# Patient Record
Sex: Female | Born: 1990 | Race: Black or African American | Hispanic: No | Marital: Married | Smoking: Former smoker
Health system: Southern US, Community
[De-identification: ages and names within clinical notes are randomized; demographics above are authoritative.]

## PROBLEM LIST (undated history)

## (undated) ENCOUNTER — Inpatient Hospital Stay (HOSPITAL_COMMUNITY): Payer: Self-pay

## (undated) DIAGNOSIS — O24419 Gestational diabetes mellitus in pregnancy, unspecified control: Secondary | ICD-10-CM

## (undated) HISTORY — DX: Gestational diabetes mellitus in pregnancy, unspecified control: O24.419

## (undated) HISTORY — PX: NO PAST SURGERIES: SHX2092

---

## 2018-04-16 DIAGNOSIS — Z3200 Encounter for pregnancy test, result unknown: Secondary | ICD-10-CM | POA: Diagnosis not present

## 2018-04-16 DIAGNOSIS — Z3009 Encounter for other general counseling and advice on contraception: Secondary | ICD-10-CM | POA: Diagnosis not present

## 2018-04-27 DIAGNOSIS — Z3009 Encounter for other general counseling and advice on contraception: Secondary | ICD-10-CM | POA: Diagnosis not present

## 2018-04-27 DIAGNOSIS — Z0389 Encounter for observation for other suspected diseases and conditions ruled out: Secondary | ICD-10-CM | POA: Diagnosis not present

## 2018-04-27 DIAGNOSIS — Z1388 Encounter for screening for disorder due to exposure to contaminants: Secondary | ICD-10-CM | POA: Diagnosis not present

## 2018-05-24 ENCOUNTER — Other Ambulatory Visit (HOSPITAL_COMMUNITY)
Admission: RE | Admit: 2018-05-24 | Discharge: 2018-05-24 | Disposition: A | Discharge status: Home / Self Care | Payer: Medicaid Other | Source: Ambulatory Visit | Attending: Advanced Practice Midwife | Admitting: Advanced Practice Midwife

## 2018-05-24 ENCOUNTER — Encounter: Payer: Self-pay | Admitting: Advanced Practice Midwife

## 2018-05-24 ENCOUNTER — Ambulatory Visit (INDEPENDENT_AMBULATORY_CARE_PROVIDER_SITE_OTHER): Payer: Medicaid Other | Admitting: Advanced Practice Midwife

## 2018-05-24 VITALS — BP 116/75 | HR 83 | Ht <= 58 in | Wt 175.0 lb

## 2018-05-24 DIAGNOSIS — O26891 Other specified pregnancy related conditions, first trimester: Secondary | ICD-10-CM

## 2018-05-24 DIAGNOSIS — Z3687 Encounter for antenatal screening for uncertain dates: Secondary | ICD-10-CM

## 2018-05-24 DIAGNOSIS — Z3490 Encounter for supervision of normal pregnancy, unspecified, unspecified trimester: Secondary | ICD-10-CM | POA: Diagnosis not present

## 2018-05-24 DIAGNOSIS — R12 Heartburn: Secondary | ICD-10-CM

## 2018-05-24 DIAGNOSIS — Z3481 Encounter for supervision of other normal pregnancy, first trimester: Secondary | ICD-10-CM

## 2018-05-24 MED ORDER — RANITIDINE HCL 150 MG PO TABS: 150.0000 mg | ORAL_TABLET | Freq: Two times a day (BID) | ORAL | 2 refills | Status: DC

## 2018-05-24 NOTE — Progress Notes (Signed)
   PRENATAL VISIT NOTE  Subjective:  Zoe Jensen is a 27 y.o. G3P2002 at [redacted]w[redacted]d being seen today for initial prenatal visit.  She is currently monitored for the following issues for this low-risk pregnancy and has Supervision of normal pregnancy on their problem list.  Patient reports heartburn.  Contractions: Not present. Vag. Bleeding: None.   . Denies leaking of fluid.   The following portions of the patient's history were reviewed and updated as appropriate: allergies, current medications, past family history, past medical history, past social history, past surgical history and problem list. Problem list updated.  Objective:   Vitals:   05/24/18 0923 05/24/18 0926  BP: 116/75   Pulse: 83   Weight: 79.4 kg   Height:  4\' 10"  (1.473 m)    Fetal Status: Fetal Heart Rate (bpm): 165          Assessment and Plan:  Pregnancy: G3P2002 at [redacted]w[redacted]d BP 116/75   Pulse 83   Ht 4\' 10"  (1.473 m)   Wt 79.4 kg   LMP 03/10/2018   BMI 36.58 kg/m    VS reviewed, nursing note reviewed,  Constitutional: well developed, well nourished, no distress HEENT: normocephalic HEART: normal rate, heart sounds, regular rhythm RESP: normal effort, lung sounds clear and equal bilaterally Abdomen: soft Neuro: alert and oriented x 3 Skin: warm, dry Psych: affect normal Pelvic exam deferred, Pap 6 months ago, records requested  1. Encounter for supervision of normal pregnancy, antepartum, unspecified gravidity --Discussed and offered genetic screening options, including Quad screen/AFP, NIPS testing, and option to decline testing. Benefits/risks/alternatives reviewed. Pt aware that anatomy US is form of genetic screening with lower accuracy in detecting trisomies than blood work.  Pt chooses/declines genetic screening today. --Anticipatory guidance about next visits/weeks of pregnancy given.  - Obstetric Panel, Including HIV - Culture, OB Urine - Cytology - PAP - Genetic Screening - Hemoglobinopathy  evaluation - Cystic Fibrosis Mutation 97 - SMN1 COPY NUMBER ANALYSIS (SMA Carrier Screen)  2. Unsure of LMP (last menstrual period) as reason for ultrasound scan  - US OB Comp Less 14 Wks; Future  3. Heartburn during pregnancy in first trimester --May use Tums PRN but if using Tums frequently, start Zantac BID PRN, see Rx.  Preterm labor symptoms and general obstetric precautions including but not limited to vaginal bleeding, contractions, leaking of fluid and fetal movement were reviewed in detail with the patient. Please refer to After Visit Summary for other counseling recommendations.  No follow-ups on file.  Future Appointments  Date Time Provider Department Center  05/31/2018  9:00 AM [facility]  06/23/2018  9:30 AM Leftwich-Kirby, Wilmer Floor, CNM [facility] None    Sharen Counter, CNM

## 2018-05-24 NOTE — Patient Instructions (Signed)
First Trimester of Pregnancy The first trimester of pregnancy is from week 1 until the end of week 13 (months 1 through 3). A week after a sperm fertilizes an egg, the egg will implant on the wall of the uterus. This embryo will begin to develop into a baby. Genes from you and your partner will form the baby. The female genes will determine whether the baby will be a boy or a girl. At 6-8 weeks, the eyes and face will be formed, and the heartbeat can be seen on ultrasound. At the end of 12 weeks, all the baby's organs will be formed. Now that you are pregnant, you will want to do everything you can to have a healthy baby. Two of the most important things are to get good prenatal care and to follow your health care provider's instructions. Prenatal care is all the medical care you receive before the baby's birth. This care will help prevent, find, and treat any problems during the pregnancy and childbirth. Body changes during your first trimester Your body goes through many changes during pregnancy. The changes vary from woman to woman.  You may gain or lose a couple of pounds at first.  You may feel sick to your stomach (nauseous) and you may throw up (vomit). If the vomiting is uncontrollable, call your health care provider.  You may tire easily.  You may develop headaches that can be relieved by medicines. All medicines should be approved by your health care provider.  You may urinate more often. Painful urination may mean you have a bladder infection.  You may develop heartburn as a result of your pregnancy.  You may develop constipation because certain hormones are causing the muscles that push stool through your intestines to slow down.  You may develop hemorrhoids or swollen veins (varicose veins).  Your breasts may begin to grow larger and become tender. Your nipples may stick out more, and the tissue that surrounds them (areola) may become darker.  Your gums may bleed and may be  sensitive to brushing and flossing.  Dark spots or blotches (chloasma, mask of pregnancy) may develop on your face. This will likely fade after the baby is born.  Your menstrual periods will stop.  You may have a loss of appetite.  You may develop cravings for certain kinds of food.  You may have changes in your emotions from day to day, such as being excited to be pregnant or being concerned that something may go wrong with the pregnancy and baby.  You may have more vivid and strange dreams.  You may have changes in your hair. These can include thickening of your hair, rapid growth, and changes in texture. Some women also have hair loss during or after pregnancy, or hair that feels dry or thin. Your hair will most likely return to normal after your baby is born.  What to expect at prenatal visits During a routine prenatal visit:  You will be weighed to make sure you and the baby are growing normally.  Your blood pressure will be taken.  Your abdomen will be measured to track your baby's growth.  The fetal heartbeat will be listened to between weeks 10 and 14 of your pregnancy.  Test results from any previous visits will be discussed.  Your health care provider may ask you:  How you are feeling.  If you are feeling the baby move.  If you have had any abnormal symptoms, such as leaking fluid, bleeding, severe headaches,   or abdominal cramping.  If you are using any tobacco products, including cigarettes, chewing tobacco, and electronic cigarettes.  If you have any questions.  Other tests that may be performed during your first trimester include:  Blood tests to find your blood type and to check for the presence of any previous infections. The tests will also be used to check for low iron levels (anemia) and protein on red blood cells (Rh antibodies). Depending on your risk factors, or if you previously had diabetes during pregnancy, you may have tests to check for high blood  sugar that affects pregnant women (gestational diabetes).  Urine tests to check for infections, diabetes, or protein in the urine.  An ultrasound to confirm the proper growth and development of the baby.  Fetal screens for spinal cord problems (spina bifida) and Down syndrome.  HIV (human immunodeficiency virus) testing. Routine prenatal testing includes screening for HIV, unless you choose not to have this test.  You may need other tests to make sure you and the baby are doing well.  Follow these instructions at home: Medicines  Follow your health care provider's instructions regarding medicine use. Specific medicines may be either safe or unsafe to take during pregnancy.  Take a prenatal vitamin that contains at least 600 micrograms (mcg) of folic acid.  If you develop constipation, try taking a stool softener if your health care provider approves. Eating and drinking  Eat a balanced diet that includes fresh fruits and vegetables, whole grains, good sources of protein such as meat, eggs, or tofu, and low-fat dairy. Your health care provider will help you determine the amount of weight gain that is right for you.  Avoid raw meat and uncooked cheese. These carry germs that can cause birth defects in the baby.  Eating four or five small meals rather than three large meals a day may help relieve nausea and vomiting. If you start to feel nauseous, eating a few soda crackers can be helpful. Drinking liquids between meals, instead of during meals, also seems to help ease nausea and vomiting.  Limit foods that are high in fat and processed sugars, such as fried and sweet foods.  To prevent constipation: ? Eat foods that are high in fiber, such as fresh fruits and vegetables, whole grains, and beans. ? Drink enough fluid to keep your urine clear or pale yellow. Activity  Exercise only as directed by your health care provider. Most women can continue their usual exercise routine during  pregnancy. Try to exercise for 30 minutes at least 5 days a week. Exercising will help you: ? Control your weight. ? Stay in shape. ? Be prepared for labor and delivery.  Experiencing pain or cramping in the lower abdomen or lower back is a good sign that you should stop exercising. Check with your health care provider before continuing with normal exercises.  Try to avoid standing for long periods of time. Move your legs often if you must stand in one place for a long time.  Avoid heavy lifting.  Wear low-heeled shoes and practice good posture.  You may continue to have sex unless your health care provider tells you not to. Relieving pain and discomfort  Wear a good support bra to relieve breast tenderness.  Take warm sitz baths to soothe any pain or discomfort caused by hemorrhoids. Use hemorrhoid cream if your health care provider approves.  Rest with your legs elevated if you have leg cramps or low back pain.  If you develop   varicose veins in your legs, wear support hose. Elevate your feet for 15 minutes, 3-4 times a day. Limit salt in your diet. Prenatal care  Schedule your prenatal visits by the twelfth week of pregnancy. They are usually scheduled monthly at first, then more often in the last 2 months before delivery.  Write down your questions. Take them to your prenatal visits.  Keep all your prenatal visits as told by your health care provider. This is important. Safety  Wear your seat belt at all times when driving.  Make a list of emergency phone numbers, including numbers for family, friends, the hospital, and police and fire departments. General instructions  Ask your health care provider for a referral to a local prenatal education class. Begin classes no later than the beginning of month 6 of your pregnancy.  Ask for help if you have counseling or nutritional needs during pregnancy. Your health care provider can offer advice or refer you to specialists for help  with various needs.  Do not use hot tubs, steam rooms, or saunas.  Do not douche or use tampons or scented sanitary pads.  Do not cross your legs for long periods of time.  Avoid cat litter boxes and soil used by cats. These carry germs that can cause birth defects in the baby and possibly loss of the fetus by miscarriage or stillbirth.  Avoid all smoking, herbs, alcohol, and medicines not prescribed by your health care provider. Chemicals in these products affect the formation and growth of the baby.  Do not use any products that contain nicotine or tobacco, such as cigarettes and e-cigarettes. If you need help quitting, ask your health care provider. You may receive counseling support and other resources to help you quit.  Schedule a dentist appointment. At home, brush your teeth with a soft toothbrush and be gentle when you floss. Contact a health care provider if:  You have dizziness.  You have mild pelvic cramps, pelvic pressure, or nagging pain in the abdominal area.  You have persistent nausea, vomiting, or diarrhea.  You have a bad smelling vaginal discharge.  You have pain when you urinate.  You notice increased swelling in your face, hands, legs, or ankles.  You are exposed to fifth disease or chickenpox.  You are exposed to German measles (rubella) and have never had it. Get help right away if:  You have a fever.  You are leaking fluid from your vagina.  You have spotting or bleeding from your vagina.  You have severe abdominal cramping or pain.  You have rapid weight gain or loss.  You vomit blood or material that looks like coffee grounds.  You develop a severe headache.  You have shortness of breath.  You have any kind of trauma, such as from a fall or a car accident. Summary  The first trimester of pregnancy is from week 1 until the end of week 13 (months 1 through 3).  Your body goes through many changes during pregnancy. The changes vary from  woman to woman.  You will have routine prenatal visits. During those visits, your health care provider will examine you, discuss any test results you may have, and talk with you about how you are feeling. This information is not intended to replace advice given to you by your health care provider. Make sure you discuss any questions you have with your health care provider. Document Released: 07/08/2001 Document Revised: 06/25/2016 Document Reviewed: 06/25/2016 Elsevier Interactive Patient Education  2018 [employer]  

## 2018-05-25 ENCOUNTER — Encounter (HOSPITAL_COMMUNITY): Payer: Self-pay

## 2018-05-25 ENCOUNTER — Inpatient Hospital Stay (HOSPITAL_COMMUNITY): Payer: Medicaid Other

## 2018-05-25 ENCOUNTER — Telehealth: Payer: Self-pay

## 2018-05-25 ENCOUNTER — Inpatient Hospital Stay (HOSPITAL_COMMUNITY)
Admission: AD | Admit: 2018-05-25 | Discharge: 2018-05-25 | Disposition: A | Discharge status: Home / Self Care | Payer: Medicaid Other | Source: Ambulatory Visit | Attending: Obstetrics & Gynecology | Admitting: Obstetrics & Gynecology

## 2018-05-25 DIAGNOSIS — Z87891 Personal history of nicotine dependence: Secondary | ICD-10-CM | POA: Diagnosis not present

## 2018-05-25 DIAGNOSIS — O209 Hemorrhage in early pregnancy, unspecified: Secondary | ICD-10-CM | POA: Diagnosis not present

## 2018-05-25 DIAGNOSIS — Z3A10 10 weeks gestation of pregnancy: Secondary | ICD-10-CM | POA: Insufficient documentation

## 2018-05-25 DIAGNOSIS — O208 Other hemorrhage in early pregnancy: Secondary | ICD-10-CM | POA: Diagnosis not present

## 2018-05-25 DIAGNOSIS — Z3491 Encounter for supervision of normal pregnancy, unspecified, first trimester: Secondary | ICD-10-CM

## 2018-05-25 LAB — URINALYSIS, MICROSCOPIC (REFLEX): RBC / HPF: >50 RBC/hpf (ref 0–5)

## 2018-05-25 LAB — URINALYSIS, ROUTINE W REFLEX MICROSCOPIC: Ketones, ur: 15 mg/dL — AB

## 2018-05-25 LAB — CBC
HEMATOCRIT: 34 % — AB (ref 36.0–46.0)
HEMOGLOBIN: 11.7 g/dL — AB (ref 12.0–15.0)
MCH: 29.1 pg (ref 26.0–34.0)
MCHC: 34.4 g/dL (ref 30.0–36.0)
MCV: 84.6 fL (ref 80.0–100.0)
PLATELETS: 212 10*3/uL (ref 150–400)
RBC: 4.02 MIL/uL (ref 3.87–5.11)
RDW: 12.8 % (ref 11.5–15.5)
WBC: 6.6 10*3/uL (ref 4.0–10.5)
nRBC: 0 % (ref 0.0–0.2)

## 2018-05-25 LAB — ABO/RH: ABO/RH(D): A POS

## 2018-05-25 LAB — URINE CYTOLOGY ANCILLARY ONLY
Chlamydia: NEGATIVE
Neisseria Gonorrhea: NEGATIVE

## 2018-05-25 NOTE — Discharge Instructions (Signed)
Subchorionic Hematoma °A subchorionic hematoma is a gathering of blood between the outer wall of the placenta and the inner wall of the womb (uterus). The placenta is the organ that connects the fetus to the wall of the uterus. The placenta performs the feeding, breathing (oxygen to the fetus), and waste removal (excretory work) of the fetus. °Subchorionic hematoma is the most common abnormality found on a result from ultrasonography done during the first trimester or early second trimester of pregnancy. If there has been little or no vaginal bleeding, early small hematomas usually shrink on their own and do not affect your baby or pregnancy. The blood is gradually absorbed over 1-2 weeks. When bleeding starts later in pregnancy or the hematoma is larger or occurs in an older pregnant woman, the outcome may not be as good. Larger hematomas may get bigger, which increases the chances for miscarriage. Subchorionic hematoma also increases the risk of premature detachment of the placenta from the uterus, preterm (premature) labor, and stillbirth. °Follow these instructions at home: °· Stay on bed rest if your health care provider recommends this. Although bed rest will not prevent more bleeding or prevent a miscarriage, your health care provider may recommend bed rest until you are advised otherwise. °· Avoid heavy lifting (more than 10 lb [4.5 kg]), exercise, sexual intercourse, or douching as directed by your health care provider. °· Keep track of the number of pads you use each day and how soaked (saturated) they are. Write down this information. °· Do not use tampons. °· Keep all follow-up appointments as directed by your health care provider. Your health care provider may ask you to have follow-up blood tests or ultrasound tests or both. °Get help right away if: °· You have severe cramps in your stomach, back, abdomen, or pelvis. °· You have a fever. °· You pass large clots or tissue. Save any tissue for your  health care provider to look at. °· Your bleeding increases or you become lightheaded, feel weak, or have fainting episodes. °This information is not intended to replace advice given to you by your health care provider. Make sure you discuss any questions you have with your health care provider. °Document Released: 10/29/2006 Document Revised: 12/20/2015 Document Reviewed: 02/10/2013 °Elsevier Interactive Patient Education © 2017 [employer] ° ° °Safe Medications in Pregnancy  ° °Acne: °Benzoyl Peroxide °Salicylic Acid ° °Backache/Headache: °Tylenol: 2 regular strength every 4 hours OR °             2 Extra strength every 6 hours ° °Colds/Coughs/Allergies: °Benadryl (alcohol free) 25 mg every 6 hours as needed °Breath right strips °Claritin °Cepacol throat lozenges °Chloraseptic throat spray °Cold-Eeze- up to three times per day °Cough drops, alcohol free °Flonase (by prescription only) °Guaifenesin °Mucinex °Robitussin DM (plain only, alcohol free) °Saline nasal spray/drops °Sudafed (pseudoephedrine) & Actifed ** use only after [redacted] weeks gestation and if you do not have high blood pressure °Tylenol °Vicks Vaporub °Zinc lozenges °Zyrtec  ° °Constipation: °Colace °Ducolax suppositories °Fleet enema °Glycerin suppositories °Metamucil °Milk of magnesia °Miralax °Senokot °Smooth move tea ° °Diarrhea: °Kaopectate °Imodium A-D ° °*NO pepto Bismol ° °Hemorrhoids: °Anusol °Anusol HC °Preparation H °Tucks ° °Indigestion: °Tums °Maalox °Mylanta °Zantac  °Pepcid ° °Insomnia: °Benadryl (alcohol free) 25mg every 6 hours as needed °Tylenol PM °Unisom, no Gelcaps ° °Leg Cramps: °Tums °MagGel ° °Nausea/Vomiting:  °Bonine °Dramamine °Emetrol °Ginger extract °Sea bands °Meclizine  °Nausea medication to take during pregnancy:  °Unisom (doxylamine succinate 25 mg tablets) Take one   tablet daily at bedtime. If symptoms are not adequately controlled, the dose can be increased to a maximum recommended dose of two tablets daily (1/2 tablet  in the morning, 1/2 tablet mid-afternoon and one at bedtime). °Vitamin B6 100mg tablets. Take one tablet twice a day (up to 200 mg per day). ° °Skin Rashes: °Aveeno products °Benadryl cream or 25mg every 6 hours as needed °Calamine Lotion °1% cortisone cream ° °Yeast infection: °Gyne-lotrimin 7 °Monistat 7 ° ° °**If taking multiple medications, please check labels to avoid duplicating the same active ingredients °**take medication as directed on the label °** Do not exceed 4000 mg of tylenol in 24 hours °**Do not take medications that contain aspirin or ibuprofen ° ° ° ° °

## 2018-05-25 NOTE — MAU Provider Note (Signed)
History     CSN: [id number]  Arrival date and time: 05/25/18 1550   First Provider Initiated Contact with Patient 05/25/18 1635     Chief Complaint  Patient presents with  . Vaginal Bleeding   HPI Zoe Jensen is a 28 y.o. G3P2002 at [redacted]w[redacted]d who presents with vaginal bleeding. She states she had a sudden onset of bright red vaginal bleeding like a period. She denies any pain. She had a prenatal appointment yesterday and FHT were heard. She hasn't had an ultrasounds in the pregnancy yet. Denies any recent intercourse.  OB History    Gravida  3   Para  2   Term  2   Preterm      AB      Living  2     SAB      TAB      Ectopic      Multiple      Live Births  2           History reviewed. No pertinent past medical history.  Past Surgical History:  Procedure Laterality Date  . NO PAST SURGERIES      Family History  Problem Relation Age of Onset  . Stroke Mother   . Hypertension Mother   . Diabetes Mother   . Hypertension Father     Social History   Tobacco Use  . Smoking status: Former Games developer  . Smokeless tobacco: Never Used  Substance Use Topics  . Alcohol use: Not Currently  . Drug use: Not Currently    Allergies: No Known Allergies  Medications Prior to Admission  Medication Sig Dispense Refill Last Dose  . Prenatal Vit-Fe Fumarate-FA (PRENATAL MULTIVITAMIN) TABS tablet Take 1 tablet by mouth daily at 12 noon.   05/24/2018 at Unknown time  . ranitidine (ZANTAC) 150 MG tablet Take 1 tablet (150 mg total) by mouth 2 (two) times daily. (Patient not taking: Reported on 05/25/2018) 60 tablet 2 Not Taking at Unknown time    Review of Systems  Constitutional: Negative.  Negative for fatigue and fever.  HENT: Negative.   Respiratory: Negative.  Negative for shortness of breath.   Cardiovascular: Negative.  Negative for chest pain.  Gastrointestinal: Negative.  Negative for abdominal pain, diarrhea, nausea and vomiting.  Genitourinary:  Positive for vaginal bleeding. Negative for dysuria.  Neurological: Negative.  Negative for dizziness and headaches.   Physical Exam   Blood pressure 127/63, pulse 78, temperature 98.6 F (37 C), resp. rate 18, height 4\' 10"  (1.473 m), weight 80.3 kg, last menstrual period 03/10/2018.  Physical Exam  Nursing note and vitals reviewed. Constitutional: She is oriented to person, place, and time. She appears well-developed and well-nourished. No distress.  HENT:  Head: Normocephalic.  Eyes: Pupils are equal, round, and reactive to light.  Cardiovascular: Normal rate, regular rhythm and normal heart sounds.  Respiratory: Effort normal and breath sounds normal. No respiratory distress.  GI: Soft. Bowel sounds are normal. She exhibits no distension. There is no tenderness.  Genitourinary:  Genitourinary Comments: Small amount of bright red vaginal bleeding oozing from cervical os, cervix closed on bimanual exam  Neurological: She is alert and oriented to person, place, and time.  Skin: Skin is warm and dry.  Psychiatric: She has a normal mood and affect. Her behavior is normal. Judgment and thought content normal.    MAU Course  Procedures Results for orders placed or performed during the hospital encounter of 05/25/18 (from the past 24 hour(s))  Urinalysis,  Routine w reflex microscopic     Status: Abnormal   Collection Time: 05/25/18  4:17 PM  Result Value Ref Range   Color, Urine RED (A) YELLOW   APPearance TURBID (A) CLEAR   Specific Gravity, Urine  1.005 - 1.030    TEST NOT REPORTED DUE TO COLOR INTERFERENCE OF URINE PIGMENT   pH  5.0 - 8.0    TEST NOT REPORTED DUE TO COLOR INTERFERENCE OF URINE PIGMENT   Glucose, UA (A) NEGATIVE mg/dL    TEST NOT REPORTED DUE TO COLOR INTERFERENCE OF URINE PIGMENT   Hgb urine dipstick (A) NEGATIVE    TEST NOT REPORTED DUE TO COLOR INTERFERENCE OF URINE PIGMENT   Bilirubin Urine (A) NEGATIVE    TEST NOT REPORTED DUE TO COLOR INTERFERENCE OF  URINE PIGMENT   Ketones, ur 15 (A) NEGATIVE mg/dL   Protein, ur (A) NEGATIVE mg/dL    TEST NOT REPORTED DUE TO COLOR INTERFERENCE OF URINE PIGMENT   Nitrite (A) NEGATIVE    TEST NOT REPORTED DUE TO COLOR INTERFERENCE OF URINE PIGMENT   Leukocytes, UA (A) NEGATIVE    TEST NOT REPORTED DUE TO COLOR INTERFERENCE OF URINE PIGMENT  Urinalysis, Microscopic (reflex)     Status: Abnormal   Collection Time: 05/25/18  4:17 PM  Result Value Ref Range   RBC / HPF >50 0 - 5 RBC/hpf   WBC, UA 6-10 0 - 5 WBC/hpf   Bacteria, UA MANY (A) NONE SEEN   Squamous Epithelial / LPF 0-5 0 - 5   Urine-Other MICROSCOPIC EXAM PERFORMED ON UNCONCENTRATED URINE   ABO/Rh     Status: None (Preliminary result)   Collection Time: 05/25/18  4:35 PM  Result Value Ref Range   ABO/RH(D)      A POS Performed at [facility], [address], [place], [place] [address]   CBC     Status: Abnormal   Collection Time: 05/25/18  4:35 PM  Result Value Ref Range   WBC 6.6 4.0 - 10.5 K/uL   RBC 4.02 3.87 - 5.11 MIL/uL   Hemoglobin 11.7 (L) 12.0 - 15.0 g/dL   HCT 91.4 (L) 78.2 - 95.6 %   MCV 84.6 80.0 - 100.0 fL   MCH 29.1 26.0 - 34.0 pg   MCHC 34.4 30.0 - 36.0 g/dL   RDW 21.3 08.6 - 57.8 %   Platelets 212 150 - 400 K/uL   nRBC 0.0 0.0 - 0.2 %   US Ob Less Than 14 Weeks With Ob Transvaginal  Result Date: 05/25/2018 CLINICAL DATA:  Vaginal bleeding. Gestational age by last menstrual period 10 weeks and 6 days. EXAM: OBSTETRIC <14 WK ULTRASOUND TECHNIQUE: Transabdominal ultrasound was performed for evaluation of the gestation as well as the maternal uterus and adnexal regions. COMPARISON:  None. FINDINGS: Intrauterine gestational sac: Present. Yolk sac:  Not present. Embryo:  Present. Cardiac Activity: Present. Heart Rate: 169 bpm CRL:   34 mm   10 w 1 d                  [place] EDC: Dec 20, 2018 Subchorionic hemorrhage:  None visualized. Maternal uterus/adnexae: Normal appearance of the adnexa. No free fluid. IMPRESSION: Single  live intrauterine pregnancy, gestational age by ultrasound 10 weeks and 6 days (concordant dates) without immediate complication. Electronically Signed   By: Awilda Metro M.D.   On: 05/25/2018 17:44   MDM UA CBC ABO/Rh- A Pos US OB Less than 14 weeks- normal IUP, no acute reason  for bleeding at this time.   Assessment and Plan   1. Normal intrauterine pregnancy on prenatal ultrasound in first trimester   2. Vaginal bleeding affecting early pregnancy   3. [redacted] weeks gestation of pregnancy    -Discharge home in stable condition -Vaginal bleeding precautions discussed. Pelvic rest reviewed -Patient advised to follow-up with [facility] as scheduled for prenatal care -Patient may return to MAU as needed or if her condition were to change or worsen  Rolm Bookbinder CNM 05/25/2018, 6:12 PM

## 2018-05-25 NOTE — MAU Note (Signed)
Pt reports she felt some "dripping when she was putting up groceries. Went to the BR and found vaginal bleeding like the start of her period. Denies any pain or cramping at this time.

## 2018-05-25 NOTE — Telephone Encounter (Signed)
Patient called stating she is experiencing sudden heavy bright red bleeding, like a menstrual period. Pt denies and cramps, states that she has had some nausea and vomiting. I advised patient go to MAU for evaluation. Pt verbalized understanding.

## 2018-05-26 LAB — OBSTETRIC PANEL, INCLUDING HIV
ANTIBODY SCREEN: NEGATIVE
BASOS: 0 %
Basophils Absolute: 0 10*3/uL (ref 0.0–0.2)
EOS (ABSOLUTE): 0.1 10*3/uL (ref 0.0–0.4)
Eos: 1 %
HEMATOCRIT: 34.3 % (ref 34.0–46.6)
HEMOGLOBIN: 11.4 g/dL (ref 11.1–15.9)
HIV Screen 4th Generation wRfx: NONREACTIVE
Hepatitis B Surface Ag: NEGATIVE
IMMATURE GRANS (ABS): 0 10*3/uL (ref 0.0–0.1)
Immature Granulocytes: 0 %
LYMPHS: 25 %
Lymphocytes Absolute: 1.4 10*3/uL (ref 0.7–3.1)
MCH: 28.3 pg (ref 26.6–33.0)
MCHC: 33.2 g/dL (ref 31.5–35.7)
MCV: 85 fL (ref 79–97)
MONOCYTES: 9 %
MONOS ABS: 0.5 10*3/uL (ref 0.1–0.9)
NEUTROS PCT: 65 %
Neutrophils Absolute: 3.7 10*3/uL (ref 1.4–7.0)
Platelets: 239 10*3/uL (ref 150–450)
RBC: 4.03 x10E6/uL (ref 3.77–5.28)
RDW: 12.6 % (ref 12.3–15.4)
RPR Ser Ql: NONREACTIVE
RUBELLA: 7.59 {index} (ref >0.99)
Rh Factor: POSITIVE
WBC: 5.8 10*3/uL (ref 3.4–10.8)

## 2018-05-26 LAB — HEMOGLOBINOPATHY EVALUATION
HEMOGLOBIN A2 QUANTITATION: 2.4 % (ref 1.8–3.2)
HEMOGLOBIN F QUANTITATION: 0 % (ref 0.0–2.0)
HGB C: 0 %
HGB S: 0 %
HGB VARIANT: 0 %
Hgb A: 97.6 % (ref 96.4–98.8)

## 2018-05-27 LAB — CULTURE, OB URINE

## 2018-05-27 LAB — URINE CULTURE, OB REFLEX

## 2018-05-31 ENCOUNTER — Ambulatory Visit (HOSPITAL_COMMUNITY): Payer: Medicaid Other

## 2018-06-02 LAB — SMN1 COPY NUMBER ANALYSIS (SMA CARRIER SCREENING)

## 2018-06-02 LAB — CYSTIC FIBROSIS MUTATION 97: GENE DIS ANAL CARRIER INTERP BLD/T-IMP: NOT DETECTED

## 2018-06-04 ENCOUNTER — Ambulatory Visit (HOSPITAL_COMMUNITY): Payer: Medicaid Other

## 2018-06-08 ENCOUNTER — Encounter (HOSPITAL_COMMUNITY): Payer: Self-pay

## 2018-06-08 ENCOUNTER — Ambulatory Visit (HOSPITAL_COMMUNITY): Admission: RE | Admit: 2018-06-08 | Payer: Medicaid Other | Source: Ambulatory Visit

## 2018-06-23 ENCOUNTER — Ambulatory Visit (INDEPENDENT_AMBULATORY_CARE_PROVIDER_SITE_OTHER): Payer: Medicaid Other | Admitting: Advanced Practice Midwife

## 2018-06-23 VITALS — BP 116/77 | HR 91 | Wt 177.6 lb

## 2018-06-23 DIAGNOSIS — Z3A15 15 weeks gestation of pregnancy: Secondary | ICD-10-CM

## 2018-06-23 DIAGNOSIS — O26891 Other specified pregnancy related conditions, first trimester: Secondary | ICD-10-CM

## 2018-06-23 DIAGNOSIS — Z3490 Encounter for supervision of normal pregnancy, unspecified, unspecified trimester: Secondary | ICD-10-CM | POA: Diagnosis not present

## 2018-06-23 DIAGNOSIS — O219 Vomiting of pregnancy, unspecified: Secondary | ICD-10-CM

## 2018-06-23 DIAGNOSIS — R12 Heartburn: Secondary | ICD-10-CM

## 2018-06-23 DIAGNOSIS — O26892 Other specified pregnancy related conditions, second trimester: Secondary | ICD-10-CM

## 2018-06-23 DIAGNOSIS — Z3A19 19 weeks gestation of pregnancy: Secondary | ICD-10-CM

## 2018-06-23 MED ORDER — METOCLOPRAMIDE HCL 10 MG PO TABS: 10.0000 mg | ORAL_TABLET | Freq: Four times a day (QID) | ORAL | 2 refills | Status: DC | PRN

## 2018-06-23 MED ORDER — RANITIDINE HCL 150 MG PO TABS: 150.0000 mg | ORAL_TABLET | Freq: Two times a day (BID) | ORAL | 2 refills | Status: DC

## 2018-06-23 NOTE — Progress Notes (Signed)
Patient reports feeling fetal flutter movements, denies pain. 

## 2018-06-23 NOTE — Patient Instructions (Signed)

## 2018-06-23 NOTE — Progress Notes (Addendum)
   PRENATAL VISIT NOTE  Subjective:  Zoe Jensen is a 27 y.o. G3P2002 at 2810w0d being seen today for ongoing prenatal care.  She is currently monitored for the following issues for this low-risk pregnancy and has Supervision of normal pregnancy on their problem list.  Patient reports nausea.  Contractions: Not present. Vag. Bleeding: None.  Movement: Present. Denies leaking of fluid.   The following portions of the patient's history were reviewed and updated as appropriate: allergies, current medications, past family history, past medical history, past social history, past surgical history and problem list. Problem list updated.  Objective:   Vitals:   06/23/18 0939  BP: 116/77  Pulse: 91  Weight: 80.6 kg    Fetal Status: Fetal Heart Rate (bpm): 150   Movement: Present     General:  Alert, oriented and cooperative. Patient is in no acute distress.  Skin: Skin is warm and dry. No rash noted.   Cardiovascular: Normal heart rate noted  Respiratory: Normal respiratory effort, no problems with respiration noted  Abdomen: Soft, gravid, appropriate for gestational age.  Pain/Pressure: Absent     Pelvic: Cervical exam deferred        Extremities: Normal range of motion.  Edema: Trace  Mental Status: Normal mood and affect. Normal behavior. Normal judgment and thought content.   Assessment and Plan:  Pregnancy: G3P2002 at 5310w0d  1. Encounter for supervision of normal pregnancy, antepartum, unspecified gravidity --Anticipatory guidance about next visits/weeks of pregnancy given. - AFP, Serum, Open Spina Bifida - US MFM OB COMP + 14 WK; Future  2. Heartburn during pregnancy in first trimester --Discussed dietary changes. - ranitidine (ZANTAC) 150 MG tablet; Take 1 tablet (150 mg total) by mouth 2 (two) times daily.  Dispense: 60 tablet; Refill: 2  3. Nausea and vomiting during pregnancy prior to [redacted] weeks gestation --Eat small frequent meals. Pt nausea mostly at work, works 3rd  shift. --Reglan Rx, take 10 mg Q 6 hours.   Preterm labor symptoms and general obstetric precautions including but not limited to vaginal bleeding, contractions, leaking of fluid and fetal movement were reviewed in detail with the patient. Please refer to After Visit Summary for other counseling recommendations.  Return in about 4 weeks (around 07/21/2018).  Future Appointments  Date Time Provider Department Center  07/27/2018  9:00 AM Arvilla MarketWallace, Catherine Lauren, DO [facility] None  07/27/2018 10:15 AM [facility] US 4 [facility]    Sharen CounterLisa Leftwich-Kirby, CNM

## 2018-06-27 LAB — AFP, SERUM, OPEN SPINA BIFIDA
AFP MOM: 1.98
AFP VALUE AFPOSL: 56.8 ng/mL
Gest. Age on Collection Date: 15 weeks
MATERNAL AGE AT EDD: 29.9 a
OSBR RISK 1 IN: 1685
Test Results:: NEGATIVE
Weight: 177 [lb_av]

## 2018-07-19 ENCOUNTER — Encounter (HOSPITAL_COMMUNITY): Payer: Self-pay

## 2018-07-27 ENCOUNTER — Ambulatory Visit (INDEPENDENT_AMBULATORY_CARE_PROVIDER_SITE_OTHER): Payer: Medicaid Other | Admitting: Internal Medicine

## 2018-07-27 ENCOUNTER — Ambulatory Visit (HOSPITAL_COMMUNITY)
Admission: RE | Admit: 2018-07-27 | Discharge: 2018-07-27 | Disposition: A | Discharge status: Home / Self Care | Payer: Medicaid Other | Source: Ambulatory Visit | Attending: Advanced Practice Midwife | Admitting: Advanced Practice Midwife

## 2018-07-27 ENCOUNTER — Other Ambulatory Visit (HOSPITAL_COMMUNITY): Payer: Self-pay | Admitting: *Deleted

## 2018-07-27 VITALS — BP 109/72 | HR 87 | Wt 184.2 lb

## 2018-07-27 DIAGNOSIS — Z363 Encounter for antenatal screening for malformations: Secondary | ICD-10-CM | POA: Diagnosis not present

## 2018-07-27 DIAGNOSIS — Z3A19 19 weeks gestation of pregnancy: Secondary | ICD-10-CM | POA: Diagnosis not present

## 2018-07-27 DIAGNOSIS — Z3490 Encounter for supervision of normal pregnancy, unspecified, unspecified trimester: Secondary | ICD-10-CM | POA: Diagnosis not present

## 2018-07-27 DIAGNOSIS — Z3492 Encounter for supervision of normal pregnancy, unspecified, second trimester: Secondary | ICD-10-CM

## 2018-07-27 DIAGNOSIS — Z362 Encounter for other antenatal screening follow-up: Secondary | ICD-10-CM

## 2018-07-27 NOTE — Patient Instructions (Signed)

## 2018-07-27 NOTE — Progress Notes (Signed)
   PRENATAL VISIT NOTE  Subjective:  Zoe Jensen is a 27 y.o. G3P2002 at 6442w6d being seen today for ongoing prenatal care.  She is currently monitored for the following issues for this low-risk pregnancy and has Supervision of normal pregnancy on their problem list.  Patient reports no complaints.  Contractions: Not present. Vag. Bleeding: None.  Movement: Present. Denies leaking of fluid.   The following portions of the patient's history were reviewed and updated as appropriate: allergies, current medications, past family history, past medical history, past social history, past surgical history and problem list. Problem list updated.  Objective:   Vitals:   07/27/18 0921  BP: 109/72  Pulse: 87  Weight: 184 lb 3.2 oz (83.6 kg)    Fetal Status: Fetal Heart Rate (bpm): 143   Movement: Present     General:  Alert, oriented and cooperative. Patient is in no acute distress.  Skin: Skin is warm and dry. No rash noted.   Cardiovascular: Normal heart rate noted  Respiratory: Normal respiratory effort, no problems with respiration noted  Abdomen: Soft, gravid, appropriate for gestational age.  Pain/Pressure: Absent     Pelvic: Cervical exam deferred        Extremities: Normal range of motion.  Edema: None  Mental Status: Normal mood and affect. Normal behavior. Normal judgment and thought content.   Assessment and Plan:  Pregnancy: G3P2002 at 1242w6d  1. Encounter for supervision of normal pregnancy in second trimester, unspecified gravidity Continue routine PNC.  Anatomy scan scheduled for today.  Declines flu vaccine.   Preterm labor symptoms and general obstetric precautions including but not limited to vaginal bleeding, contractions, leaking of fluid and fetal movement were reviewed in detail with the patient. Please refer to After Visit Summary for other counseling recommendations.  Return in about 4 weeks (around 08/24/2018) for routine PNC.  Future Appointments  Date Time  Provider Department Center  07/27/2018 10:15 AM [facility] US 4 [facility]    De Hollingsheadatherine L Shemica Meath, DO

## 2018-07-28 NOTE — L&D Delivery Note (Addendum)
Delivery Note At 9:55 AM a viable and healthy female was delivered via Vaginal, Spontaneous (Presentation: ROA ).  APGAR: 7, 9; weight 6 lb 10 oz (3005 g).   Placenta status: spontaneous, intact .  Cord: 3 vessel with the following complications: none.    Anesthesia:  epidural Episiotomy: None Lacerations: None Suture Repair: none Est. Blood Loss (mL): 100  Mom to postpartum.  Baby to Couplet care / Skin to Skin.  Zoe Jensen is a 28 y.o. female G3P2002 with IUP at [redacted]w[redacted]d admitted for SROM and active labor.  She progressed without augmentation to complete and pushed less than 30 minutes to deliver. Anterior shoulder delivered slowly but without dystocia and loose nuchal x 1 unable to reduce and infant delivered through without difficulty. Cord clamping delayed by 1 minute then clamped by CNM and cut by FOB. Infant initially not vigorous but with good heartrate and tone, and was taken to warmer for further stimulation but quickly transitioned and was returned to pt arms before 5 minute Apgar with good cry/respirations and color.  Placenta intact and spontaneous, bleeding minimal. Intact perineum.  Mom and baby stable prior to transfer to postpartum. She plans on breastfeeding. She requests IUD for birth control.   Sharen Counter 12/09/2018, 11:47 AM

## 2018-08-24 ENCOUNTER — Ambulatory Visit (INDEPENDENT_AMBULATORY_CARE_PROVIDER_SITE_OTHER): Payer: Medicaid Other | Admitting: Obstetrics and Gynecology

## 2018-08-24 ENCOUNTER — Ambulatory Visit (HOSPITAL_COMMUNITY)
Admission: RE | Admit: 2018-08-24 | Discharge: 2018-08-24 | Disposition: A | Discharge status: Home / Self Care | Payer: Medicaid Other | Source: Ambulatory Visit | Attending: Advanced Practice Midwife | Admitting: Advanced Practice Midwife

## 2018-08-24 ENCOUNTER — Other Ambulatory Visit: Payer: Self-pay

## 2018-08-24 ENCOUNTER — Encounter: Payer: Self-pay | Admitting: Obstetrics and Gynecology

## 2018-08-24 VITALS — BP 116/70 | HR 90 | Wt 185.6 lb

## 2018-08-24 DIAGNOSIS — Z3A23 23 weeks gestation of pregnancy: Secondary | ICD-10-CM

## 2018-08-24 DIAGNOSIS — Z362 Encounter for other antenatal screening follow-up: Secondary | ICD-10-CM | POA: Diagnosis not present

## 2018-08-24 DIAGNOSIS — Z3482 Encounter for supervision of other normal pregnancy, second trimester: Secondary | ICD-10-CM

## 2018-08-24 DIAGNOSIS — M62838 Other muscle spasm: Secondary | ICD-10-CM

## 2018-08-24 MED ORDER — CYCLOBENZAPRINE HCL 10 MG PO TABS: 10.0000 mg | ORAL_TABLET | Freq: Three times a day (TID) | ORAL | 1 refills | Status: DC | PRN

## 2018-08-24 NOTE — Progress Notes (Signed)
ROB.  C/o back spasms 7/10 x 2 months and dry, scaly patches on breast around nipples. Request for Release of Records faxed to [facility]

## 2018-08-24 NOTE — Progress Notes (Signed)
Subjective:  Zoe Jensen is a 28 y.o. G3P2002 at [redacted]w[redacted]d being seen today for ongoing prenatal care.  She is currently monitored for the following issues for this low-risk pregnancy and has Supervision of normal pregnancy and Muscle spasm on their problem list.  Patient reports back muscle spasms.  Contractions: Irritability. Vag. Bleeding: None.  Movement: Present. Denies leaking of fluid.   The following portions of the patient's history were reviewed and updated as appropriate: allergies, current medications, past family history, past medical history, past social history, past surgical history and problem list. Problem list updated.  Objective:   Vitals:   08/24/18 0911  BP: 116/70  Pulse: 90  Weight: 185 lb 9.6 oz (84.2 kg)    Fetal Status: Fetal Heart Rate (bpm): 149   Movement: Present     General:  Alert, oriented and cooperative. Patient is in no acute distress.  Skin: Skin is warm and dry. No rash noted.   Cardiovascular: Normal heart rate noted  Respiratory: Normal respiratory effort, no problems with respiration noted  Abdomen: Soft, gravid, appropriate for gestational age. Pain/Pressure: Present     Pelvic:  Cervical exam deferred        Extremities: Normal range of motion.  Edema: None  Mental Status: Normal mood and affect. Normal behavior. Normal judgment and thought content.   Urinalysis:      Assessment and Plan:  Pregnancy: G3P2002 at [redacted]w[redacted]d  1. Encounter for supervision of other normal pregnancy in second trimester Stable F/U anatomy scan today Glucola next visit  2. Muscle spasm - cyclobenzaprine (FLEXERIL) 10 MG tablet; Take 1 tablet (10 mg total) by mouth every 8 (eight) hours as needed for muscle spasms.  Dispense: 30 tablet; Refill: 1  Preterm labor symptoms and general obstetric precautions including but not limited to vaginal bleeding, contractions, leaking of fluid and fetal movement were reviewed in detail with the patient. Please refer to After  Visit Summary for other counseling recommendations.  Return in about 4 weeks (around 09/21/2018) for OB visit.   Hermina Staggers, MD

## 2018-08-24 NOTE — Patient Instructions (Signed)
Third Trimester of Pregnancy The third trimester is from week 28 through week 40 (months 7 through 9). The third trimester is a time when the unborn baby (fetus) is growing rapidly. At the end of the ninth month, the fetus is about 20 inches in length and weighs 6-10 pounds. Body changes during your third trimester Your body will continue to go through many changes during pregnancy. The changes vary from woman to woman. During the third trimester:  Your weight will continue to increase. You can expect to gain 25-35 pounds (11-16 kg) by the end of the pregnancy.  You may begin to get stretch marks on your hips, abdomen, and breasts.  You may urinate more often because the fetus is moving lower into your pelvis and pressing on your bladder.  You may develop or continue to have heartburn. This is caused by increased hormones that slow down muscles in the digestive tract.  You may develop or continue to have constipation because increased hormones slow digestion and cause the muscles that push waste through your intestines to relax.  You may develop hemorrhoids. These are swollen veins (varicose veins) in the rectum that can itch or be painful.  You may develop swollen, bulging veins (varicose veins) in your legs.  You may have increased body aches in the pelvis, back, or thighs. This is due to weight gain and increased hormones that are relaxing your joints.  You may have changes in your hair. These can include thickening of your hair, rapid growth, and changes in texture. Some women also have hair loss during or after pregnancy, or hair that feels dry or thin. Your hair will most likely return to normal after your baby is born.  Your breasts will continue to grow and they will continue to become tender. A yellow fluid (colostrum) may leak from your breasts. This is the first milk you are producing for your baby.  Your belly button may stick out.  You may notice more swelling in your hands,  face, or ankles.  You may have increased tingling or numbness in your hands, arms, and legs. The skin on your belly may also feel numb.  You may feel short of breath because of your expanding uterus.  You may have more problems sleeping. This can be caused by the size of your belly, increased need to urinate, and an increase in your body's metabolism.  You may notice the fetus "dropping," or moving lower in your abdomen (lightening).  You may have increased vaginal discharge.  You may notice your joints feel loose and you may have pain around your pelvic bone. What to expect at prenatal visits You will have prenatal exams every 2 weeks until week 36. Then you will have weekly prenatal exams. During a routine prenatal visit:  You will be weighed to make sure you and the baby are growing normally.  Your blood pressure will be taken.  Your abdomen will be measured to track your baby's growth.  The fetal heartbeat will be listened to.  Any test results from the previous visit will be discussed.  You may have a cervical check near your due date to see if your cervix has softened or thinned (effaced).  You will be tested for Group B streptococcus. This happens between 35 and 37 weeks. Your health care provider may ask you:  What your birth plan is.  How you are feeling.  If you are feeling the baby move.  If you have had any abnormal   symptoms, such as leaking fluid, bleeding, severe headaches, or abdominal cramping.  If you are using any tobacco products, including cigarettes, chewing tobacco, and electronic cigarettes.  If you have any questions. Other tests or screenings that may be performed during your third trimester include:  Blood tests that check for low iron levels (anemia).  Fetal testing to check the health, activity level, and growth of the fetus. Testing is done if you have certain medical conditions or if there are problems during the pregnancy.  Nonstress test  (NST). This test checks the health of your baby to make sure there are no signs of problems, such as the baby not getting enough oxygen. During this test, a belt is placed around your belly. The baby is made to move, and its heart rate is monitored during movement. What is false labor? False labor is a condition in which you feel small, irregular tightenings of the muscles in the womb (contractions) that usually go away with rest, changing position, or drinking water. These are called Braxton Hicks contractions. Contractions may last for hours, days, or even weeks before true labor sets in. If contractions come at regular intervals, become more frequent, increase in intensity, or become painful, you should see your health care provider. What are the signs of labor?  Abdominal cramps.  Regular contractions that start at 10 minutes apart and become stronger and more frequent with time.  Contractions that start on the top of the uterus and spread down to the lower abdomen and back.  Increased pelvic pressure and dull back pain.  A watery or bloody mucus discharge that comes from the vagina.  Leaking of amniotic fluid. This is also known as your "water breaking." It could be a slow trickle or a gush. Let your health care provider know if it has a color or strange odor. If you have any of these signs, call your health care provider right away, even if it is before your due date. Follow these instructions at home: Medicines  Follow your health care provider's instructions regarding medicine use. Specific medicines may be either safe or unsafe to take during pregnancy.  Take a prenatal vitamin that contains at least 600 micrograms (mcg) of folic acid.  If you develop constipation, try taking a stool softener if your health care provider approves. Eating and drinking   Eat a balanced diet that includes fresh fruits and vegetables, whole grains, good sources of protein such as meat, eggs, or tofu,  and low-fat dairy. Your health care provider will help you determine the amount of weight gain that is right for you.  Avoid raw meat and uncooked cheese. These carry germs that can cause birth defects in the baby.  If you have low calcium intake from food, talk to your health care provider about whether you should take a daily calcium supplement.  Eat four or five small meals rather than three large meals a day.  Limit foods that are high in fat and processed sugars, such as fried and sweet foods.  To prevent constipation: ? Drink enough fluid to keep your urine clear or pale yellow. ? Eat foods that are high in fiber, such as fresh fruits and vegetables, whole grains, and beans. Activity  Exercise only as directed by your health care provider. Most women can continue their usual exercise routine during pregnancy. Try to exercise for 30 minutes at least 5 days a week. Stop exercising if you experience uterine contractions.  Avoid heavy lifting.  Do   not exercise in extreme heat or humidity, or at high altitudes.  Wear low-heel, comfortable shoes.  Practice good posture.  You may continue to have sex unless your health care provider tells you otherwise. Relieving pain and discomfort  Take frequent breaks and rest with your legs elevated if you have leg cramps or low back pain.  Take warm sitz baths to soothe any pain or discomfort caused by hemorrhoids. Use hemorrhoid cream if your health care provider approves.  Wear a good support bra to prevent discomfort from breast tenderness.  If you develop varicose veins: ? Wear support pantyhose or compression stockings as told by your healthcare provider. ? Elevate your feet for 15 minutes, 3-4 times a day. Prenatal care  Write down your questions. Take them to your prenatal visits.  Keep all your prenatal visits as told by your health care provider. This is important. Safety  Wear your seat belt at all times when driving.  Make  a list of emergency phone numbers, including numbers for family, friends, the hospital, and police and fire departments. General instructions  Avoid cat litter boxes and soil used by cats. These carry germs that can cause birth defects in the baby. If you have a cat, ask someone to clean the litter box for you.  Do not travel far distances unless it is absolutely necessary and only with the approval of your health care provider.  Do not use hot tubs, steam rooms, or saunas.  Do not drink alcohol.  Do not use any products that contain nicotine or tobacco, such as cigarettes and e-cigarettes. If you need help quitting, ask your health care provider.  Do not use any medicinal herbs or unprescribed drugs. These chemicals affect the formation and growth of the baby.  Do not douche or use tampons or scented sanitary pads.  Do not cross your legs for long periods of time.  To prepare for the arrival of your baby: ? Take prenatal classes to understand, practice, and ask questions about labor and delivery. ? Make a trial run to the hospital. ? Visit the hospital and tour the maternity area. ? Arrange for maternity or paternity leave through employers. ? Arrange for family and friends to take care of pets while you are in the hospital. ? Purchase a rear-facing car seat and make sure you know how to install it in your car. ? Pack your hospital bag. ? Prepare the baby's nursery. Make sure to remove all pillows and stuffed animals from the baby's crib to prevent suffocation.  Visit your dentist if you have not gone during your pregnancy. Use a soft toothbrush to brush your teeth and be gentle when you floss. Contact a health care provider if:  You are unsure if you are in labor or if your water has broken.  You become dizzy.  You have mild pelvic cramps, pelvic pressure, or nagging pain in your abdominal area.  You have lower back pain.  You have persistent nausea, vomiting, or  diarrhea.  You have an unusual or bad smelling vaginal discharge.  You have pain when you urinate. Get help right away if:  Your water breaks before 37 weeks.  You have regular contractions less than 5 minutes apart before 37 weeks.  You have a fever.  You are leaking fluid from your vagina.  You have spotting or bleeding from your vagina.  You have severe abdominal pain or cramping.  You have rapid weight loss or weight gain.  You have   shortness of breath with chest pain.  You notice sudden or extreme swelling of your face, hands, ankles, feet, or legs.  Your baby makes fewer than 10 movements in 2 hours.  You have severe headaches that do not go away when you take medicine.  You have vision changes. Summary  The third trimester is from week 28 through week 40, months 7 through 9. The third trimester is a time when the unborn baby (fetus) is growing rapidly.  During the third trimester, your discomfort may increase as you and your baby continue to gain weight. You may have abdominal, leg, and back pain, sleeping problems, and an increased need to urinate.  During the third trimester your breasts will keep growing and they will continue to become tender. A yellow fluid (colostrum) may leak from your breasts. This is the first milk you are producing for your baby.  False labor is a condition in which you feel small, irregular tightenings of the muscles in the womb (contractions) that eventually go away. These are called Braxton Hicks contractions. Contractions may last for hours, days, or even weeks before true labor sets in.  Signs of labor can include: abdominal cramps; regular contractions that start at 10 minutes apart and become stronger and more frequent with time; watery or bloody mucus discharge that comes from the vagina; increased pelvic pressure and dull back pain; and leaking of amniotic fluid. This information is not intended to replace advice given to you by your  health care provider. Make sure you discuss any questions you have with your health care provider. Document Released: 07/08/2001 Document Revised: 08/19/2016 Document Reviewed: 08/19/2016 Elsevier Interactive Patient Education  2019 [employer]  

## 2018-09-21 ENCOUNTER — Other Ambulatory Visit: Payer: Medicaid Other

## 2018-09-21 ENCOUNTER — Encounter: Payer: Self-pay | Admitting: Obstetrics and Gynecology

## 2018-09-21 ENCOUNTER — Ambulatory Visit (INDEPENDENT_AMBULATORY_CARE_PROVIDER_SITE_OTHER): Payer: Medicaid Other | Admitting: Obstetrics and Gynecology

## 2018-09-21 VITALS — BP 106/71 | HR 97 | Wt 179.0 lb

## 2018-09-21 DIAGNOSIS — Z3482 Encounter for supervision of other normal pregnancy, second trimester: Secondary | ICD-10-CM

## 2018-09-21 DIAGNOSIS — Z3483 Encounter for supervision of other normal pregnancy, third trimester: Secondary | ICD-10-CM

## 2018-09-21 DIAGNOSIS — Z3A27 27 weeks gestation of pregnancy: Secondary | ICD-10-CM

## 2018-09-21 DIAGNOSIS — O24419 Gestational diabetes mellitus in pregnancy, unspecified control: Secondary | ICD-10-CM

## 2018-09-21 MED ORDER — CLOBETASOL PROPIONATE 0.05 % EX OINT: 1.0000 "application " | TOPICAL_OINTMENT | Freq: Two times a day (BID) | CUTANEOUS | 2 refills | Status: DC

## 2018-09-21 NOTE — Progress Notes (Signed)
   PRENATAL VISIT NOTE  Subjective:  Zoe Jensen is a 28 y.o. G3P2002 at [redacted]w[redacted]d being seen today for ongoing prenatal care.  She is currently monitored for the following issues for this low-risk pregnancy and has Supervision of normal pregnancy and Muscle spasm on their problem list.  Patient reports dry skin and eczema like areas on arms.  Contractions: Not present. Vag. Bleeding: None.  Movement: Present. Denies leaking of fluid.   The following portions of the patient's history were reviewed and updated as appropriate: allergies, current medications, past family history, past medical history, past social history, past surgical history and problem list. Problem list updated.  Objective:   Vitals:   09/21/18 0816  BP: 106/71  Pulse: 97  Weight: 179 lb (81.2 kg)    Fetal Status: Fetal Heart Rate (bpm): 150 Fundal Height: 28 cm Movement: Present     General:  Alert, oriented and cooperative. Patient is in no acute distress.  Skin: Skin is warm and dry. Dry flat rash on forearm bilaterally.   Cardiovascular: Normal heart rate noted  Respiratory: Normal respiratory effort, no problems with respiration noted  Abdomen: Soft, gravid, appropriate for gestational age.  Pain/Pressure: Absent     Pelvic: Cervical exam deferred        Extremities: Normal range of motion.     Mental Status: Normal mood and affect. Normal behavior. Normal judgment and thought content.   Assessment and Plan:  Pregnancy: G3P2002 at [redacted]w[redacted]d  1. Encounter for supervision of other normal pregnancy in third trimester Third trimester labs today Rx steroid cream provided Patient desires IUD for contraception  Preterm labor symptoms and general obstetric precautions including but not limited to vaginal bleeding, contractions, leaking of fluid and fetal movement were reviewed in detail with the patient. Please refer to After Visit Summary for other counseling recommendations.  No follow-ups on file.  Future  Appointments  Date Time Provider Department Center  10/05/2018 10:00 AM Gerrit Heck, CNM [facility] None    Catalina Antigua, MD

## 2018-09-21 NOTE — Addendum Note (Signed)
Addended by: Catalina Antigua on: 09/21/2018 11:01 AM   Modules accepted: Orders

## 2018-09-22 LAB — RPR: RPR: NONREACTIVE

## 2018-09-22 LAB — CBC
HEMATOCRIT: 32.4 % — AB (ref 34.0–46.6)
HEMOGLOBIN: 11 g/dL — AB (ref 11.1–15.9)
MCH: 28.9 pg (ref 26.6–33.0)
MCHC: 34 g/dL (ref 31.5–35.7)
MCV: 85 fL (ref 79–97)
Platelets: 185 10*3/uL (ref 150–450)
RBC: 3.8 x10E6/uL (ref 3.77–5.28)
RDW: 12.9 % (ref 11.7–15.4)
WBC: 7.7 10*3/uL (ref 3.4–10.8)

## 2018-09-22 LAB — HIV ANTIBODY (ROUTINE TESTING W REFLEX): HIV SCREEN 4TH GENERATION: NONREACTIVE

## 2018-09-22 LAB — GLUCOSE TOLERANCE, 2 HOURS W/ 1HR
GLUCOSE, 1 HOUR: 188 mg/dL — AB (ref 65–179)
GLUCOSE, 2 HOUR: 136 mg/dL (ref 65–152)
Glucose, Fasting: 86 mg/dL (ref 65–91)

## 2018-09-23 ENCOUNTER — Telehealth: Payer: Self-pay

## 2018-09-23 DIAGNOSIS — O24419 Gestational diabetes mellitus in pregnancy, unspecified control: Secondary | ICD-10-CM | POA: Insufficient documentation

## 2018-09-23 MED ORDER — ACCU-CHEK FASTCLIX LANCETS MISC: 1.0000 [IU] | Freq: Four times a day (QID) | 12 refills | Status: DC

## 2018-09-23 MED ORDER — GLUCOSE BLOOD VI STRP: ORAL_STRIP | 12 refills | Status: DC

## 2018-09-23 MED ORDER — ACCU-CHEK NANO SMARTVIEW W/DEVICE KIT: 1.0000 | PACK | 0 refills | Status: DC

## 2018-09-23 NOTE — Addendum Note (Signed)
Addended by: Catalina Antigua on: 09/23/2018 07:21 AM   Modules accepted: Orders

## 2018-09-23 NOTE — Addendum Note (Signed)
Addended by: Pennie Banter on: 09/23/2018 02:44 PM   Modules accepted: Orders

## 2018-09-23 NOTE — Telephone Encounter (Signed)
Spoke with pt and advised of 2hr results, referral, and rx sent. Pt has diabetic teaching appt scheduled for 09-29-18.

## 2018-09-24 DIAGNOSIS — Z23 Encounter for immunization: Secondary | ICD-10-CM | POA: Diagnosis not present

## 2018-09-28 ENCOUNTER — Telehealth: Payer: Self-pay

## 2018-09-28 ENCOUNTER — Other Ambulatory Visit: Payer: Self-pay

## 2018-09-28 MED ORDER — ACCU-CHEK FASTCLIX LANCETS MISC: 1.0000 | Freq: Four times a day (QID) | 12 refills | Status: DC

## 2018-09-28 MED ORDER — ACCU-CHEK GUIDE W/DEVICE KIT: 1.0000 | PACK | Freq: Four times a day (QID) | 0 refills | Status: DC

## 2018-09-28 MED ORDER — GLUCOSE BLOOD VI STRP: ORAL_STRIP | 12 refills | Status: DC

## 2018-09-28 NOTE — Telephone Encounter (Signed)
Error  pt has already been contacted regarding GDM devices

## 2018-09-28 NOTE — Telephone Encounter (Signed)
Spoke with pt and advised that new rx for diabetic supplies was sent to the pharmacy.

## 2018-09-29 ENCOUNTER — Encounter: Payer: Self-pay | Admitting: Registered"

## 2018-09-29 ENCOUNTER — Encounter: Payer: Medicaid Other | Attending: Obstetrics and Gynecology | Admitting: Registered"

## 2018-09-29 DIAGNOSIS — O99810 Abnormal glucose complicating pregnancy: Secondary | ICD-10-CM | POA: Insufficient documentation

## 2018-09-29 NOTE — Progress Notes (Signed)

## 2018-10-05 ENCOUNTER — Other Ambulatory Visit (HOSPITAL_COMMUNITY)
Admission: RE | Admit: 2018-10-05 | Discharge: 2018-10-05 | Disposition: A | Discharge status: Home / Self Care | Payer: Medicaid Other | Source: Ambulatory Visit

## 2018-10-05 ENCOUNTER — Ambulatory Visit (INDEPENDENT_AMBULATORY_CARE_PROVIDER_SITE_OTHER): Payer: Medicaid Other

## 2018-10-05 VITALS — BP 113/72 | HR 91 | Wt 179.2 lb

## 2018-10-05 DIAGNOSIS — R350 Frequency of micturition: Secondary | ICD-10-CM

## 2018-10-05 DIAGNOSIS — N898 Other specified noninflammatory disorders of vagina: Secondary | ICD-10-CM | POA: Insufficient documentation

## 2018-10-05 DIAGNOSIS — Z3480 Encounter for supervision of other normal pregnancy, unspecified trimester: Secondary | ICD-10-CM

## 2018-10-05 DIAGNOSIS — O24419 Gestational diabetes mellitus in pregnancy, unspecified control: Secondary | ICD-10-CM

## 2018-10-05 DIAGNOSIS — Z3A29 29 weeks gestation of pregnancy: Secondary | ICD-10-CM

## 2018-10-05 LAB — POCT URINALYSIS DIP (MANUAL ENTRY)
Bilirubin, UA: NEGATIVE
GLUCOSE UA: NEGATIVE mg/dL
Ketones, POC UA: NEGATIVE mg/dL
Leukocytes, UA: NEGATIVE
NITRITE UA: NEGATIVE
PH UA: 7.5 (ref 5.0–8.0)
RBC UA: NEGATIVE
SPEC GRAV UA: 1.01 (ref 1.010–1.025)
UROBILINOGEN UA: 0.2 U/dL

## 2018-10-05 NOTE — Progress Notes (Signed)
Patient reports vaginal irritation and itching on/off for the last two weeks; denies vaginal discharge.

## 2018-10-05 NOTE — Progress Notes (Signed)
   PRENATAL VISIT NOTE  Subjective:  Zoe Jensen is a 28 y.o. G3P2002 at [redacted]w[redacted]d who presents today for routine prenatal care.  She is currently being monitored for supervision of a low-risk pregnancy with problems as listed below.  Patient has no pregnancy related concerns and states she is coping well with her diabetic regimen.  She endorses fetal movement. However she expresses some vaginal concerns with itching since changing her washing detergent.  She denies discharge, bleeding, leaking, itching, and burning.   Patient Active Problem List   Diagnosis Date Noted  . Abnormal glucose tolerance test (GTT) during pregnancy, antepartum 09/29/2018  . Gestational diabetes mellitus (GDM) affecting pregnancy, antepartum 09/23/2018  . Muscle spasm 08/24/2018  . Supervision of normal pregnancy 05/24/2018    The following portions of the patient's history were reviewed and updated as appropriate: allergies, current medications, past family history, past medical history, past social history, past surgical history and problem list. Problem list updated.  Objective:   Vitals:   10/05/18 1014  BP: 113/72  Pulse: 91  Weight: 179 lb 3.2 oz (81.3 kg)    Fetal Status: Fetal Heart Rate (bpm): 151 Fundal Height: 30 cm Movement: Present     General:  Alert, oriented and cooperative. Patient is in no acute distress.  Skin: Skin is warm and dry.   Cardiovascular: Regular rate and rhythm.  Respiratory: Normal respiratory effort. CTA-Bilaterally  Abdomen: Soft, gravid, appropriate for gestational age.  Pelvic: Cervical exam deferred        Extremities: Normal range of motion.  Edema: None  Mental Status: Normal mood and affect. Normal behavior. Normal judgment and thought content.   Assessment and Plan:  Pregnancy: G3P2002 at [redacted]w[redacted]d  1. Supervision of other normal pregnancy, antepartum -Anticipatory Guidance for upcoming appointments. -Educated on new [facility] and how to get to location. -Discussed  MAU for emergency situations. -No other questions or concerns  2. Vaginal itching -Discussed sending self-swab and will call for any abnormal results - Cervicovaginal ancillary only( [place])  3. Urine frequency - Culture, OB Urine - POCT urinalysis dipstick  4. Gestational diabetes mellitus (GDM) affecting pregnancy, antepartum -CBG log reviewed and good control noted; *Fasting 86-97 *Breakfast PP 105-133 *Lunch 83-109 with outlier 154  *Dinner [id number] with outlier 156 -Discussed need to get fetal testing due to diabetic status - US MFM OB COMP + 14 WK; Future -BPP ordered by mistake and order cancel request sent. -Encouraged to keep up good management of diabetes.  Term labor symptoms and general obstetric precautions including but not limited to vaginal bleeding, contractions, leaking of fluid and fetal movement were reviewed with the patient.  Please refer to After Visit Summary for other counseling recommendations.  Return in about 2 weeks (around 10/19/2018) for ROB.  Future Appointments  Date Time Provider Department Center  10/19/2018  8:45 AM [facility] [place] [facility]  10/19/2018  9:45 AM Gerrit Heck, CNM [facility] None    Cherre Robins, CNM 10/05/2018, 12:53 PM

## 2018-10-05 NOTE — Patient Instructions (Signed)
Gestational Diabetes Mellitus, Diagnosis Gestational diabetes (gestational diabetes mellitus) is a temporary form of diabetes that some women develop during pregnancy. It usually occurs around weeks 24-28 of pregnancy, and it goes away after delivery. Hormonal changes during pregnancy can interfere with insulin production and function, which may result in one or both of these problems:  The pancreas does not make enough of a hormone called insulin.  Cells in the body do not respond properly to insulin that the body makes (insulin resistance). Normally, insulin allows blood sugar (glucose) to enter cells in the body. The cells use glucose for energy. Insulin resistance or lack of insulin causes excess glucose to build up in the blood instead of going into cells. As a result, high blood glucose (hyperglycemia) develops. If gestational diabetes is treated, it is not likely to cause problems. If it is not controlled with treatment, it may cause problems during labor and delivery, and some of those problems can be harmful to the unborn baby (fetus) and the mother. Women who get gestational diabetes are more likely to develop it if they get pregnant again, and they are more likely to develop type 2 diabetes in the future. What increases the risk? This condition may be more likely to develop in pregnant women who:  Are older than age 30 during pregnancy.  Have a family history of diabetes.  Are overweight.  Had gestational diabetes in the past.  Have polycystic ovary syndrome (PCOS).  Are pregnant with twins or multiples.  Are of American-Indian, African-American, Hispanic/Latino, or Asian/Pacific Islander descent. What are the signs or symptoms? Most women do not notice symptoms of gestational diabetes because the symptoms are similar to normal symptoms of pregnancy. Symptoms of gestational diabetes may include:  Increased thirst (polydipsia).  Increased hunger(polyphagia).  Increased  urination (polyuria). How is this diagnosed? This condition may be diagnosed based on your blood glucose level, which may be checked with one or more of the following blood tests:  A fasting blood glucose (FBG) test. You will not be allowed to eat (you will fast) for 8 hours or longer before a blood sample is taken.  A random blood glucose test. This checks your blood glucose at any time of day regardless of when you ate.  An oral glucose tolerance test (OGTT). This is usually done during weeks 24-28 of pregnancy. ? For this test, you will have an FBG test done. Then, you will drink a beverage that contains glucose. Your blood glucose will be tested again one hour after you drink the glucose beverage (1-hour OGTT). ? If the 1-hour OGTT result is at or above 140 mg/dL (7.8 mmol/L), you will repeat the OGTT. This time, your blood glucose will be tested 3 hours after you drink the glucose beverage (3-hour OGTT). If you have risk factors, you may be screened for undiagnosed type 2 diabetes at your first health care visit during your pregnancy (prenatal visit). How is this treated?     Your treatment may be managed by a specialist called an endocrinologist. This condition is treated by following instructions from your health care provider about:  Eating a healthy diet and getting more physical activity. These changes are the most important ways to manage gestational diabetes.  Checking your blood glucose. Do this as often as told.  Taking diabetes medicines or insulin every day. These will only be prescribed if they are needed. ? If you use insulin, you may need to adjust your dosage based on how physically active  you are and what foods you eat. Your health care provider will tell you how to do this. Your health care provider will set treatment goals for you based on the stage of your pregnancy and any other medical conditions you have. Generally, the goal of treatment is to maintain the following  blood glucose levels during pregnancy:  Before meals (preprandial): at or below 95 mg/dL (5.3 mmol/L).  After meals (postprandial): ? One hour after a meal: at or below 140 mg/dL (7.8 mmol/L). ? Two hours after a meal: at or below 120 mg/dL (6.7 mmol/L).  A1c (hemoglobin A1c) level: 6-6.5%. Follow these instructions at home: Questions to ask your health care provider  Consider asking the following questions: ? Do I need to meet with a diabetes educator? ? What equipment will I need to manage my diabetes at home? ? What diabetes medicines do I need, and when should I take them? ? How often do I need to check my blood glucose? ? What number can I call if I have questions? ? When is my next appointment? General instructions  Take over-the-counter and prescription medicines only as told by your health care provider.  Manage your weight gain during pregnancy. The amount of weight that you are expected to gain depends on your pre-pregnancy BMI (body mass index).  Keep all follow-up visits as told by your health care provider. This is important.  For more information about diabetes, visit: ? American Diabetes Association (ADA): www.diabetes.org ? American Association of Diabetes Educators (AADE): www.diabeteseducator.org Contact a health care provider if:  Your blood glucose level is at or above 240 mg/dL (31.5 mmol/L).  Your blood glucose level is at or above 200 mg/dL (94.5 mmol/L) and you have ketones in your urine.  You have been sick or have had a fever for 2 days or longer and you are not getting better.  You have any of the following problems for more than 6 hours: ? You cannot eat or drink. ? You have nausea and vomiting. ? You have diarrhea. Get help right away if:  Your blood glucose is lower than 54 mg/dL (3 mmol/L).  You become confused or you have trouble thinking clearly.  You have difficulty breathing.  You have moderate or large ketone levels in your  urine.  Your baby is moving around less than usual.  You develop unusual discharge or bleeding from your vagina.  You start having contractions early (prematurely). Contractions may feel like a tightening in your lower abdomen. Summary  Gestational diabetes (gestational diabetes mellitus) is a temporary form of diabetes that some women develop during pregnancy. It usually occurs around weeks 24-28 of pregnancy, and it goes away after delivery.  This condition is treated by making diet and lifestyle changes and taking diabetes medicines or insulin, if needed.  Women who get gestational diabetes are more likely to develop it if they get pregnant again, and they are more likely to develop type 2 diabetes in the future. This information is not intended to replace advice given to you by your health care provider. Make sure you discuss any questions you have with your health care provider. Document Released: 10/20/2000 Document Revised: 07/01/2017 Document Reviewed: 08/17/2015 Elsevier Interactive Patient Education  2019 [employer].

## 2018-10-06 LAB — CERVICOVAGINAL ANCILLARY ONLY
BACTERIAL VAGINITIS: POSITIVE — AB
Candida vaginitis: NEGATIVE

## 2018-10-07 LAB — CULTURE, OB URINE

## 2018-10-07 LAB — URINE CULTURE, OB REFLEX

## 2018-10-19 ENCOUNTER — Other Ambulatory Visit: Payer: Self-pay

## 2018-10-19 ENCOUNTER — Ambulatory Visit (HOSPITAL_COMMUNITY)
Admission: RE | Admit: 2018-10-19 | Discharge: 2018-10-19 | Disposition: A | Discharge status: Home / Self Care | Payer: Medicaid Other | Source: Ambulatory Visit

## 2018-10-19 ENCOUNTER — Ambulatory Visit (INDEPENDENT_AMBULATORY_CARE_PROVIDER_SITE_OTHER): Payer: Medicaid Other

## 2018-10-19 ENCOUNTER — Other Ambulatory Visit (HOSPITAL_COMMUNITY): Payer: Self-pay | Admitting: *Deleted

## 2018-10-19 VITALS — BP 118/69 | HR 84 | Wt 178.6 lb

## 2018-10-19 DIAGNOSIS — O24419 Gestational diabetes mellitus in pregnancy, unspecified control: Secondary | ICD-10-CM

## 2018-10-19 DIAGNOSIS — O24410 Gestational diabetes mellitus in pregnancy, diet controlled: Secondary | ICD-10-CM

## 2018-10-19 DIAGNOSIS — O99213 Obesity complicating pregnancy, third trimester: Secondary | ICD-10-CM

## 2018-10-19 DIAGNOSIS — Z3A31 31 weeks gestation of pregnancy: Secondary | ICD-10-CM

## 2018-10-19 DIAGNOSIS — Z362 Encounter for other antenatal screening follow-up: Secondary | ICD-10-CM | POA: Diagnosis not present

## 2018-10-19 DIAGNOSIS — Z3480 Encounter for supervision of other normal pregnancy, unspecified trimester: Secondary | ICD-10-CM

## 2018-10-19 DIAGNOSIS — B9689 Other specified bacterial agents as the cause of diseases classified elsewhere: Secondary | ICD-10-CM

## 2018-10-19 DIAGNOSIS — O23599 Infection of other part of genital tract in pregnancy, unspecified trimester: Secondary | ICD-10-CM

## 2018-10-19 DIAGNOSIS — O23593 Infection of other part of genital tract in pregnancy, third trimester: Secondary | ICD-10-CM

## 2018-10-19 MED ORDER — METRONIDAZOLE 500 MG PO TABS: 500.0000 mg | ORAL_TABLET | Freq: Two times a day (BID) | ORAL | 0 refills | Status: DC

## 2018-10-19 NOTE — Patient Instructions (Signed)
Bacterial Vaginosis    Bacterial vaginosis is a vaginal infection that occurs when the normal balance of bacteria in the vagina is disrupted. It results from an overgrowth of certain bacteria. This is the most common vaginal infection among women ages 15-44.  Because bacterial vaginosis increases your risk for STIs (sexually transmitted infections), getting treated can help reduce your risk for chlamydia, gonorrhea, herpes, and HIV (human immunodeficiency virus). Treatment is also important for preventing complications in pregnant women, because this condition can cause an early (premature) delivery.  What are the causes?  This condition is caused by an increase in harmful bacteria that are normally present in small amounts in the vagina. However, the reason that the condition develops is not fully understood.  What increases the risk?  The following factors may make you more likely to develop this condition:  · Having a new sexual partner or multiple sexual partners.  · Having unprotected sex.  · Douching.  · Having an intrauterine device (IUD).  · Smoking.  · Drug and alcohol abuse.  · Taking certain antibiotic medicines.  · Being pregnant.  You cannot get bacterial vaginosis from toilet seats, bedding, swimming pools, or contact with objects around you.  What are the signs or symptoms?  Symptoms of this condition include:  · Grey or white vaginal discharge. The discharge can also be watery or foamy.  · A fish-like odor with discharge, especially after sexual intercourse or during menstruation.  · Itching in and around the vagina.  · Burning or pain with urination.  Some women with bacterial vaginosis have no signs or symptoms.  How is this diagnosed?  This condition is diagnosed based on:  · Your medical history.  · A physical exam of the vagina.  · Testing a sample of vaginal fluid under a microscope to look for a large amount of bad bacteria or abnormal cells. Your health care provider may use a cotton swab or  a small wooden spatula to collect the sample.  How is this treated?  This condition is treated with antibiotics. These may be given as a pill, a vaginal cream, or a medicine that is put into the vagina (suppository). If the condition comes back after treatment, a second round of antibiotics may be needed.  Follow these instructions at home:  Medicines  · Take over-the-counter and prescription medicines only as told by your health care provider.  · Take or use your antibiotic as told by your health care provider. Do not stop taking or using the antibiotic even if you start to feel better.  General instructions  · If you have a female sexual partner, tell her that you have a vaginal infection. She should see her health care provider and be treated if she has symptoms. If you have a female sexual partner, he does not need treatment.  · During treatment:  ? Avoid sexual activity until you finish treatment.  ? Do not douche.  ? Avoid alcohol as directed by your health care provider.  ? Avoid breastfeeding as directed by your health care provider.  · Drink enough water and fluids to keep your urine clear or pale yellow.  · Keep the area around your vagina and rectum clean.  ? Wash the area daily with warm water.  ? Wipe yourself from front to back after using the toilet.  · Keep all follow-up visits as told by your health care provider. This is important.  How is this prevented?  · Do not   douche.  · Wash the outside of your vagina with warm water only.  · Use protection when having sex. This includes latex condoms and dental dams.  · Limit how many sexual partners you have. To help prevent bacterial vaginosis, it is best to have sex with just one partner (monogamous).  · Make sure you and your sexual partner are tested for STIs.  · Wear cotton or cotton-lined underwear.  · Avoid wearing tight pants and pantyhose, especially during summer.  · Limit the amount of alcohol that you drink.  · Do not use any products that contain  nicotine or tobacco, such as cigarettes and e-cigarettes. If you need help quitting, ask your health care provider.  · Do not use illegal drugs.  Where to find more information  · Centers for Disease Control and Prevention: www.cdc.gov/std  · American Sexual Health Association (ASHA): www.ashastd.org  · U.S. Department of Health and Human Services, Office on Women's Health: www.womenshealth.gov/ or https://www.womenshealth.gov/a-z-topics/bacterial-vaginosis  Contact a health care provider if:  · Your symptoms do not improve, even after treatment.  · You have more discharge or pain when urinating.  · You have a fever.  · You have pain in your abdomen.  · You have pain during sex.  · You have vaginal bleeding between periods.  Summary  · Bacterial vaginosis is a vaginal infection that occurs when the normal balance of bacteria in the vagina is disrupted.  · Because bacterial vaginosis increases your risk for STIs (sexually transmitted infections), getting treated can help reduce your risk for chlamydia, gonorrhea, herpes, and HIV (human immunodeficiency virus). Treatment is also important for preventing complications in pregnant women, because the condition can cause an early (premature) delivery.  · This condition is treated with antibiotic medicines. These may be given as a pill, a vaginal cream, or a medicine that is put into the vagina (suppository).  This information is not intended to replace advice given to you by your health care provider. Make sure you discuss any questions you have with your health care provider.  Document Released: 07/14/2005 Document Revised: 11/17/2016 Document Reviewed: 03/29/2016  Elsevier Interactive Patient Education © 2019 Elsevier Inc[employer]

## 2018-10-19 NOTE — Progress Notes (Addendum)
   PRENATAL VISIT NOTE  Subjective:  Zoe Jensen is a 28 y.o. G3P2002 at [redacted]w[redacted]d who presents today for routine prenatal care.  She is currently being monitored for supervision of a high-risk pregnancy with problems as listed below.  Patient has no pregnancy related concerns and endorses fetal movement.  She denies vaginal concerns including discharge, bleeding, leaking, or burning. She endorses continued itching and states she did not receive notification of her positive BV results.  Patient reports sugars have been well maintained.   Patient Active Problem List   Diagnosis Date Noted  . Abnormal glucose tolerance test (GTT) during pregnancy, antepartum 09/29/2018  . Gestational diabetes mellitus (GDM) affecting pregnancy, antepartum 09/23/2018  . Muscle spasm 08/24/2018  . Supervision of normal pregnancy 05/24/2018    The following portions of the patient's history were reviewed and updated as appropriate: allergies, current medications, past family history, past medical history, past social history, past surgical history and problem list. Problem list updated.  Objective:   Vitals:   10/19/18 1006  BP: 118/69  Pulse: 84  Weight: 178 lb 9.6 oz (81 kg)    Fetal Status: Fetal Heart Rate (bpm): 148 Fundal Height: 33 cm Movement: Present     General:  Alert, oriented and cooperative. Patient is in no acute distress.  Skin: Skin is warm and dry.   Cardiovascular: Regular rate .  Respiratory: Normal respiratory effort.   Abdomen: Soft, gravid, appropriate for gestational age.  Pelvic: Cervical exam deferred        Extremities: Normal range of motion.  Edema: None  Mental Status: Normal mood and affect. Normal behavior. Normal judgment and thought content.   Assessment and Plan:  Pregnancy: G3P2002 at [redacted]w[redacted]d  1. Supervision of other normal pregnancy, antepartum -Anticipatory guidance given for upcoming appts and testing. *AFP, Serum, Open Spina Bifida -Discussed option of  telehealth visit for next appt in context of normal range BS. -Informed that following appt (36 weeks) would have to be office visit to obtain GBS culture. -Discussed inability to review [place] today as it is not yet available, reassured that if it was abnormal Dr. Judeth Cornfield would have contacted provider directly.  2. Gestational diabetes mellitus (GDM) affecting pregnancy, antepartum -BS reviewed with Ranges as below: Fasting:  66-111 Breakfast: 71-128 Lunch: 73-123 Dinner: 83-127, with outlier 150 x 1 -Encouraged to continue proper nutrition intake with restriction/limitation of sugars as appropriate. -Discussed antenatal testing to include growth [place] in 4 weeks. -Plan for NST x 2 or BPP at 40 weeks.  3. Bacterial vaginosis in pregnancy -Discussed BV findings from last visit. -Educated on BV, treatment, symptoms, and how to treat. -Agreeable to oral treatment: Rx for Metronidazole 500mg  BID x 7 days, Disp 14, RF 0. sent to pharmacy on file.  -Encouraged to call if questions, concerns, or worsening of symptoms.   Preterm labor symptoms and general obstetric precautions including but not limited to vaginal bleeding, contractions, leaking of fluid and fetal movement were reviewed with the patient.  Please refer to After Visit Summary for other counseling recommendations.  Return in about 2 weeks (around 11/02/2018) for ROB-LR.  Future Appointments  Date Time Provider Department Center  11/02/2018 11:00 AM Adam Phenix, MD [facility] None  11/16/2018 10:00 AM [facility] [place] 3 [facility]    Cherre Robins, CNM 10/19/2018, 11:44 AM

## 2018-10-28 ENCOUNTER — Telehealth: Payer: Self-pay

## 2018-10-28 NOTE — Telephone Encounter (Signed)
[facility] called and stated that the AFP test they received from 10/19/18, the patient is too far along for this test. They would like to know if you still want them to do it or just cancel the order. Please call them back at [phone], option 2 then option 3, specimen ID [id number].

## 2018-10-29 LAB — AFP, SERUM, OPEN SPINA BIFIDA

## 2018-10-29 NOTE — Telephone Encounter (Signed)
Cancelled.  Thanks, JE

## 2018-11-02 ENCOUNTER — Encounter: Payer: Self-pay | Admitting: Obstetrics & Gynecology

## 2018-11-02 ENCOUNTER — Ambulatory Visit (INDEPENDENT_AMBULATORY_CARE_PROVIDER_SITE_OTHER): Payer: Medicaid Other | Admitting: Obstetrics & Gynecology

## 2018-11-02 ENCOUNTER — Other Ambulatory Visit: Payer: Self-pay

## 2018-11-02 DIAGNOSIS — Z3A33 33 weeks gestation of pregnancy: Secondary | ICD-10-CM

## 2018-11-02 DIAGNOSIS — O24419 Gestational diabetes mellitus in pregnancy, unspecified control: Secondary | ICD-10-CM

## 2018-11-02 DIAGNOSIS — Z3480 Encounter for supervision of other normal pregnancy, unspecified trimester: Secondary | ICD-10-CM

## 2018-11-02 NOTE — Progress Notes (Signed)
   TELEHEALTH VIRTUAL OBSTETRICS VISIT ENCOUNTER NOTE  I connected with [name] on 11/02/18 at 11:00 AM EDT by telephone at home and verified that I am speaking with the correct person using two identifiers.   I discussed the limitations, risks, security and privacy concerns of performing an evaluation and management service by telephone and the availability of in person appointments. I also discussed with the patient that there may be a patient responsible charge related to this service. The patient expressed understanding and agreed to proceed.  Subjective:  Zoe Jensen is a 28 y.o. G3P2002 at [redacted]w[redacted]d being followed for ongoing prenatal care.  She is currently monitored for the following issues for this high-risk pregnancy and has Supervision of normal pregnancy; Muscle spasm; Gestational diabetes mellitus (GDM) affecting pregnancy, antepartum; and Abnormal glucose tolerance test (GTT) during pregnancy, antepartum on their problem list.  Patient reports nausea when she takes Flagyl. No vaginal discharge. Reports fetal movement. Denies any contractions, bleeding or leaking of fluid.   The following portions of the patient's history were reviewed and updated as appropriate: allergies, current medications, past family history, past medical history, past social history, past surgical history and problem list.   Objective:   General:  Alert, oriented and cooperative.   Mental Status: Normal mood and affect perceived. Normal judgment and thought content.  Rest of physical exam deferred due to type of encounter  Assessment and Plan:  Pregnancy: G3P2002 at [redacted]w[redacted]d 1. Supervision of other normal pregnancy, antepartum Will start BP at home - Babyscripts Schedule Optimization  2. Gestational diabetes mellitus (GDM) affecting pregnancy, antepartum Normal FBS and PP reported diet controlled  Preterm labor symptoms and general obstetric precautions including but not limited to vaginal  bleeding, contractions, leaking of fluid and fetal movement were reviewed in detail with the patient.  I discussed the assessment and treatment plan with the patient. The patient was provided an opportunity to ask questions and all were answered. The patient agreed with the plan and demonstrated an understanding of the instructions. The patient was advised to call back or seek an in-person office evaluation/go to [facility] at [facility] for any urgent or concerning symptoms. Please refer to After Visit Summary for other counseling recommendations.   I provided 10 minutes of non-face-to-face time during this encounter.  Return in about 2 weeks (around 11/16/2018) for gbs.  Future Appointments  Date Time Provider Department Center  11/16/2018 10:00 AM [facility] [place] 3 [facility]    Scheryl Darter, MD [facility]

## 2018-11-02 NOTE — Patient Instructions (Signed)

## 2018-11-02 NOTE — Progress Notes (Signed)
Pt has not been able to take BV Rx Metronidazole consistently due to pills making her sick. Would like cream Rx for BV.  Blood Pressure cuff placed upfront

## 2018-11-16 ENCOUNTER — Encounter: Payer: Self-pay | Admitting: Obstetrics and Gynecology

## 2018-11-16 ENCOUNTER — Other Ambulatory Visit: Payer: Self-pay

## 2018-11-16 ENCOUNTER — Ambulatory Visit (INDEPENDENT_AMBULATORY_CARE_PROVIDER_SITE_OTHER): Payer: Medicaid Other | Admitting: Obstetrics and Gynecology

## 2018-11-16 ENCOUNTER — Other Ambulatory Visit (HOSPITAL_COMMUNITY)
Admission: RE | Admit: 2018-11-16 | Discharge: 2018-11-16 | Disposition: A | Discharge status: Home / Self Care | Payer: Medicaid Other | Source: Ambulatory Visit | Attending: Obstetrics and Gynecology | Admitting: Obstetrics and Gynecology

## 2018-11-16 ENCOUNTER — Ambulatory Visit (HOSPITAL_COMMUNITY)
Admission: RE | Admit: 2018-11-16 | Discharge: 2018-11-16 | Disposition: A | Discharge status: Home / Self Care | Payer: Medicaid Other | Source: Ambulatory Visit | Attending: Obstetrics and Gynecology | Admitting: Obstetrics and Gynecology

## 2018-11-16 VITALS — BP 110/72 | HR 102 | Wt 176.8 lb

## 2018-11-16 DIAGNOSIS — Z3A35 35 weeks gestation of pregnancy: Secondary | ICD-10-CM | POA: Diagnosis not present

## 2018-11-16 DIAGNOSIS — O99213 Obesity complicating pregnancy, third trimester: Secondary | ICD-10-CM

## 2018-11-16 DIAGNOSIS — Z3480 Encounter for supervision of other normal pregnancy, unspecified trimester: Secondary | ICD-10-CM | POA: Diagnosis not present

## 2018-11-16 DIAGNOSIS — Z362 Encounter for other antenatal screening follow-up: Secondary | ICD-10-CM | POA: Diagnosis not present

## 2018-11-16 DIAGNOSIS — O24410 Gestational diabetes mellitus in pregnancy, diet controlled: Secondary | ICD-10-CM

## 2018-11-16 DIAGNOSIS — O24419 Gestational diabetes mellitus in pregnancy, unspecified control: Secondary | ICD-10-CM

## 2018-11-16 NOTE — Patient Instructions (Addendum)
Third Trimester of Pregnancy The third trimester is from week 28 through week 40 (months 7 through 9). The third trimester is a time when the unborn baby (fetus) is growing rapidly. At the end of the ninth month, the fetus is about 20 inches in length and weighs 6-10 pounds. Body changes during your third trimester Your body will continue to go through many changes during pregnancy. The changes vary from woman to woman. During the third trimester:  Your weight will continue to increase. You can expect to gain 25-35 pounds (11-16 kg) by the end of the pregnancy.  You may begin to get stretch marks on your hips, abdomen, and breasts.  You may urinate more often because the fetus is moving lower into your pelvis and pressing on your bladder.  You may develop or continue to have heartburn. This is caused by increased hormones that slow down muscles in the digestive tract.  You may develop or continue to have constipation because increased hormones slow digestion and cause the muscles that push waste through your intestines to relax.  You may develop hemorrhoids. These are swollen veins (varicose veins) in the rectum that can itch or be painful.  You may develop swollen, bulging veins (varicose veins) in your legs.  You may have increased body aches in the pelvis, back, or thighs. This is due to weight gain and increased hormones that are relaxing your joints.  You may have changes in your hair. These can include thickening of your hair, rapid growth, and changes in texture. Some women also have hair loss during or after pregnancy, or hair that feels dry or thin. Your hair will most likely return to normal after your baby is born.  Your breasts will continue to grow and they will continue to become tender. A yellow fluid (colostrum) may leak from your breasts. This is the first milk you are producing for your baby.  Your belly button may stick out.  You may notice more swelling in your hands,  face, or ankles.  You may have increased tingling or numbness in your hands, arms, and legs. The skin on your belly may also feel numb.  You may feel short of breath because of your expanding uterus.  You may have more problems sleeping. This can be caused by the size of your belly, increased need to urinate, and an increase in your body's metabolism.  You may notice the fetus "dropping," or moving lower in your abdomen (lightening).  You may have increased vaginal discharge.  You may notice your joints feel loose and you may have pain around your pelvic bone. What to expect at prenatal visits You will have prenatal exams every 2 weeks until week 36. Then you will have weekly prenatal exams. During a routine prenatal visit:  You will be weighed to make sure you and the baby are growing normally.  Your blood pressure will be taken.  Your abdomen will be measured to track your baby's growth.  The fetal heartbeat will be listened to.  Any test results from the previous visit will be discussed.  You may have a cervical check near your due date to see if your cervix has softened or thinned (effaced).  You will be tested for Group B streptococcus. This happens between 35 and 37 weeks. Your health care provider may ask you:  What your birth plan is.  How you are feeling.  If you are feeling the baby move.  If you have had any abnormal   symptoms, such as leaking fluid, bleeding, severe headaches, or abdominal cramping.  If you are using any tobacco products, including cigarettes, chewing tobacco, and electronic cigarettes.  If you have any questions. Other tests or screenings that may be performed during your third trimester include:  Blood tests that check for low iron levels (anemia).  Fetal testing to check the health, activity level, and growth of the fetus. Testing is done if you have certain medical conditions or if there are problems during the pregnancy.  Nonstress test  (NST). This test checks the health of your baby to make sure there are no signs of problems, such as the baby not getting enough oxygen. During this test, a belt is placed around your belly. The baby is made to move, and its heart rate is monitored during movement. What is false labor? False labor is a condition in which you feel small, irregular tightenings of the muscles in the womb (contractions) that usually go away with rest, changing position, or drinking water. These are called Braxton Hicks contractions. Contractions may last for hours, days, or even weeks before true labor sets in. If contractions come at regular intervals, become more frequent, increase in intensity, or become painful, you should see your health care provider. What are the signs of labor?  Abdominal cramps.  Regular contractions that start at 10 minutes apart and become stronger and more frequent with time.  Contractions that start on the top of the uterus and spread down to the lower abdomen and back.  Increased pelvic pressure and dull back pain.  A watery or bloody mucus discharge that comes from the vagina.  Leaking of amniotic fluid. This is also known as your "water breaking." It could be a slow trickle or a gush. Let your health care provider know if it has a color or strange odor. If you have any of these signs, call your health care provider right away, even if it is before your due date. Follow these instructions at home: Medicines  Follow your health care provider's instructions regarding medicine use. Specific medicines may be either safe or unsafe to take during pregnancy.  Take a prenatal vitamin that contains at least 600 micrograms (mcg) of folic acid.  If you develop constipation, try taking a stool softener if your health care provider approves. Eating and drinking   Eat a balanced diet that includes fresh fruits and vegetables, whole grains, good sources of protein such as meat, eggs, or tofu,  and low-fat dairy. Your health care provider will help you determine the amount of weight gain that is right for you.  Avoid raw meat and uncooked cheese. These carry germs that can cause birth defects in the baby.  If you have low calcium intake from food, talk to your health care provider about whether you should take a daily calcium supplement.  Eat four or five small meals rather than three large meals a day.  Limit foods that are high in fat and processed sugars, such as fried and sweet foods.  To prevent constipation: ? Drink enough fluid to keep your urine clear or pale yellow. ? Eat foods that are high in fiber, such as fresh fruits and vegetables, whole grains, and beans. Activity  Exercise only as directed by your health care provider. Most women can continue their usual exercise routine during pregnancy. Try to exercise for 30 minutes at least 5 days a week. Stop exercising if you experience uterine contractions.  Avoid heavy lifting.  Do   not exercise in extreme heat or humidity, or at high altitudes.  Wear low-heel, comfortable shoes.  Practice good posture.  You may continue to have sex unless your health care provider tells you otherwise. Relieving pain and discomfort  Take frequent breaks and rest with your legs elevated if you have leg cramps or low back pain.  Take warm sitz baths to soothe any pain or discomfort caused by hemorrhoids. Use hemorrhoid cream if your health care provider approves.  Wear a good support bra to prevent discomfort from breast tenderness.  If you develop varicose veins: ? Wear support pantyhose or compression stockings as told by your healthcare provider. ? Elevate your feet for 15 minutes, 3-4 times a day. Prenatal care  Write down your questions. Take them to your prenatal visits.  Keep all your prenatal visits as told by your health care provider. This is important. Safety  Wear your seat belt at all times when driving.  Make  a list of emergency phone numbers, including numbers for family, friends, the hospital, and police and fire departments. General instructions  Avoid cat litter boxes and soil used by cats. These carry germs that can cause birth defects in the baby. If you have a cat, ask someone to clean the litter box for you.  Do not travel far distances unless it is absolutely necessary and only with the approval of your health care provider.  Do not use hot tubs, steam rooms, or saunas.  Do not drink alcohol.  Do not use any products that contain nicotine or tobacco, such as cigarettes and e-cigarettes. If you need help quitting, ask your health care provider.  Do not use any medicinal herbs or unprescribed drugs. These chemicals affect the formation and growth of the baby.  Do not douche or use tampons or scented sanitary pads.  Do not cross your legs for long periods of time.  To prepare for the arrival of your baby: ? Take prenatal classes to understand, practice, and ask questions about labor and delivery. ? Make a trial run to the hospital. ? Visit the hospital and tour the maternity area. ? Arrange for maternity or paternity leave through employers. ? Arrange for family and friends to take care of pets while you are in the hospital. ? Purchase a rear-facing car seat and make sure you know how to install it in your car. ? Pack your hospital bag. ? Prepare the baby's nursery. Make sure to remove all pillows and stuffed animals from the baby's crib to prevent suffocation.  Visit your dentist if you have not gone during your pregnancy. Use a soft toothbrush to brush your teeth and be gentle when you floss. Contact a health care provider if:  You are unsure if you are in labor or if your water has broken.  You become dizzy.  You have mild pelvic cramps, pelvic pressure, or nagging pain in your abdominal area.  You have lower back pain.  You have persistent nausea, vomiting, or  diarrhea.  You have an unusual or bad smelling vaginal discharge.  You have pain when you urinate. Get help right away if:  Your water breaks before 37 weeks.  You have regular contractions less than 5 minutes apart before 37 weeks.  You have a fever.  You are leaking fluid from your vagina.  You have spotting or bleeding from your vagina.  You have severe abdominal pain or cramping.  You have rapid weight loss or weight gain.  You have   shortness of breath with chest pain.  You notice sudden or extreme swelling of your face, hands, ankles, feet, or legs.  Your baby makes fewer than 10 movements in 2 hours.  You have severe headaches that do not go away when you take medicine.  You have vision changes. Summary  The third trimester is from week 28 through week 40, months 7 through 9. The third trimester is a time when the unborn baby (fetus) is growing rapidly.  During the third trimester, your discomfort may increase as you and your baby continue to gain weight. You may have abdominal, leg, and back pain, sleeping problems, and an increased need to urinate.  During the third trimester your breasts will keep growing and they will continue to become tender. A yellow fluid (colostrum) may leak from your breasts. This is the first milk you are producing for your baby.  False labor is a condition in which you feel small, irregular tightenings of the muscles in the womb (contractions) that eventually go away. These are called Braxton Hicks contractions. Contractions may last for hours, days, or even weeks before true labor sets in.  Signs of labor can include: abdominal cramps; regular contractions that start at 10 minutes apart and become stronger and more frequent with time; watery or bloody mucus discharge that comes from the vagina; increased pelvic pressure and dull back pain; and leaking of amniotic fluid. This information is not intended to replace advice given to you by your  health care provider. Make sure you discuss any questions you have with your health care provider. Document Released: 07/08/2001 Document Revised: 08/19/2016 Document Reviewed: 08/19/2016 Elsevier Interactive Patient Education  2019 [employer] Vaginal Delivery  Vaginal delivery means that you give birth by pushing your baby out of your birth canal (vagina). A team of health care providers will help you before, during, and after vaginal delivery. Birth experiences are unique for every woman and every pregnancy, and birth experiences vary depending on where you choose to give birth. What happens when I arrive at the birth center or hospital? Once you are in labor and have been admitted into the hospital or birth center, your health care provider may:  Review your pregnancy history and any concerns that you have.  Insert an IV into one of your veins. This may be used to give you fluids and medicines.  Check your blood pressure, pulse, temperature, and heart rate (vital signs).  Check whether your bag of water (amniotic sac) has broken (ruptured).  Talk with you about your birth plan and discuss pain control options. Monitoring Your health care provider may monitor your contractions (uterine monitoring) and your baby's heart rate (fetal monitoring). You may need to be monitored:  Often, but not continuously (intermittently).  All the time or for long periods at a time (continuously). Continuous monitoring may be needed if: ? You are taking certain medicines, such as medicine to relieve pain or make your contractions stronger. ? You have pregnancy or labor complications. Monitoring may be done by:  Placing a special stethoscope or a handheld monitoring device on your abdomen to check your baby's heartbeat and to check for contractions.  Placing monitors on your abdomen (external monitors) to record your baby's heartbeat and the frequency and length of contractions.  Placing monitors  inside your uterus through your vagina (internal monitors) to record your baby's heartbeat and the frequency, length, and strength of your contractions. Depending on the type of monitor, it may remain in   your uterus or on your baby's head until birth.  Telemetry. This is a type of continuous monitoring that can be done with external or internal monitors. Instead of having to stay in bed, you are able to move around during telemetry. Physical exam Your health care provider may perform frequent physical exams. This may include:  Checking how and where your baby is positioned in your uterus.  Checking your cervix to determine: ? Whether it is thinning out (effacing). ? Whether it is opening up (dilating). What happens during labor and delivery?  Normal labor and delivery is divided into the following three stages: Stage 1  This is the longest stage of labor.  This stage can last for hours or days.  Throughout this stage, you will feel contractions. Contractions generally feel mild, infrequent, and irregular at first. They get stronger, more frequent (about every 2-3 minutes), and more regular as you move through this stage.  This stage ends when your cervix is completely dilated to 4 inches (10 cm) and completely effaced. Stage 2  This stage starts once your cervix is completely effaced and dilated and lasts until the delivery of your baby.  This stage may last from 20 minutes to 2 hours.  This is the stage where you will feel an urge to push your baby out of your vagina.  You may feel stretching and burning pain, especially when the widest part of your baby's head passes through the vaginal opening (crowning).  Once your baby is delivered, the umbilical cord will be clamped and cut. This usually occurs after waiting a period of 1-2 minutes after delivery.  Your baby will be placed on your bare chest (skin-to-skin contact) in an upright position and covered with a warm blanket. Watch  your baby for feeding cues, like rooting or sucking, and help the baby to your breast for his or her first feeding. Stage 3  This stage starts immediately after the birth of your baby and ends after you deliver the placenta.  This stage may take anywhere from 5 to 30 minutes.  After your baby has been delivered, you will feel contractions as your body expels the placenta and your uterus contracts to control bleeding. What can I expect after labor and delivery?  After labor is over, you and your baby will be monitored closely until you are ready to go home to ensure that you are both healthy. Your health care team will teach you how to care for yourself and your baby.  You and your baby will stay in the same room (rooming in) during your hospital stay. This will encourage early bonding and successful breastfeeding.  You may continue to receive fluids and medicines through an IV.  Your uterus will be checked and massaged regularly (fundal massage).  You will have some soreness and pain in your abdomen, vagina, and the area of skin between your vaginal opening and your anus (perineum).  If an incision was made near your vagina (episiotomy) or if you had some vaginal tearing during delivery, cold compresses may be placed on your episiotomy or your tear. This helps to reduce pain and swelling.  You may be given a squirt bottle to use instead of wiping when you go to the bathroom. To use the squirt bottle, follow these steps: ? Before you urinate, fill the squirt bottle with warm water. Do not use hot water. ? After you urinate, while you are sitting on the toilet, use the squirt bottle to rinse   the area around your urethra and vaginal opening. This rinses away any urine and blood. ? Fill the squirt bottle with clean water every time you use the bathroom.  It is normal to have vaginal bleeding after delivery. Wear a sanitary pad for vaginal bleeding and discharge. Summary  Vaginal delivery  means that you will give birth by pushing your baby out of your birth canal (vagina).  Your health care provider may monitor your contractions (uterine monitoring) and your baby's heart rate (fetal monitoring).  Your health care provider may perform a physical exam.  Normal labor and delivery is divided into three stages.  After labor is over, you and your baby will be monitored closely until you are ready to go home. This information is not intended to replace advice given to you by your health care provider. Make sure you discuss any questions you have with your health care provider. Document Released: 04/22/2008 Document Revised: 08/18/2017 Document Reviewed: 08/18/2017 Elsevier Interactive Patient Education  2019 [employer]  

## 2018-11-16 NOTE — Progress Notes (Signed)
Patient reports fetal movement with irregular contractions. 

## 2018-11-16 NOTE — Progress Notes (Signed)
Subjective:  Zoe Jensen is a 28 y.o. G3P2002 at [redacted]w[redacted]d being seen today for ongoing prenatal care.  She is currently monitored for the following issues for this high-risk pregnancy and has Supervision of normal pregnancy; Muscle spasm; Gestational diabetes mellitus (GDM) affecting pregnancy, antepartum; and Abnormal glucose tolerance test (GTT) during pregnancy, antepartum on their problem list.  Patient reports general discomforts of pregnancy.  Contractions: Irregular. Vag. Bleeding: None.  Movement: Present. Denies leaking of fluid.   The following portions of the patient's history were reviewed and updated as appropriate: allergies, current medications, past family history, past medical history, past social history, past surgical history and problem list. Problem list updated.  Objective:   Vitals:   11/16/18 1345  BP: 110/72  Pulse: (!) 102  Weight: 176 lb 12.8 oz (80.2 kg)    Fetal Status:     Movement: Present     General:  Alert, oriented and cooperative. Patient is in no acute distress.  Skin: Skin is warm and dry. No rash noted.   Cardiovascular: Normal heart rate noted  Respiratory: Normal respiratory effort, no problems with respiration noted  Abdomen: Soft, gravid, appropriate for gestational age. Pain/Pressure: Present     Pelvic:  Cervical exam performed        Extremities: Normal range of motion.  Edema: None  Mental Status: Normal mood and affect. Normal behavior. Normal judgment and thought content.   Urinalysis:      Assessment and Plan:  Pregnancy: G3P2002 at [redacted]w[redacted]d  1. Supervision of other normal pregnancy, antepartum Labor precautions - Strep Gp B NAA - Cervicovaginal ancillary only( [place])  2. Gestational diabetes mellitus (GDM) affecting pregnancy, antepartum CBG's in goal range U/S today 16 % growth, F/U scheduled in 3 weeks  Preterm labor symptoms and general obstetric precautions including but not limited to vaginal bleeding, contractions,  leaking of fluid and fetal movement were reviewed in detail with the patient. Please refer to After Visit Summary for other counseling recommendations.  Return in about 2 weeks (around 11/30/2018) for OB visit, televisit.   Hermina Staggers, MD

## 2018-11-17 LAB — CERVICOVAGINAL ANCILLARY ONLY
Chlamydia: NEGATIVE
Neisseria Gonorrhea: NEGATIVE

## 2018-11-18 ENCOUNTER — Other Ambulatory Visit (HOSPITAL_COMMUNITY): Payer: Self-pay | Admitting: *Deleted

## 2018-11-18 DIAGNOSIS — O24410 Gestational diabetes mellitus in pregnancy, diet controlled: Secondary | ICD-10-CM

## 2018-11-18 LAB — STREP GP B NAA: Strep Gp B NAA: POSITIVE — AB

## 2018-11-30 ENCOUNTER — Ambulatory Visit (INDEPENDENT_AMBULATORY_CARE_PROVIDER_SITE_OTHER): Payer: Medicaid Other | Admitting: Obstetrics and Gynecology

## 2018-11-30 ENCOUNTER — Other Ambulatory Visit: Payer: Self-pay

## 2018-11-30 ENCOUNTER — Encounter: Payer: Self-pay | Admitting: Obstetrics and Gynecology

## 2018-11-30 DIAGNOSIS — O98913 Unspecified maternal infectious and parasitic disease complicating pregnancy, third trimester: Secondary | ICD-10-CM

## 2018-11-30 DIAGNOSIS — Z3483 Encounter for supervision of other normal pregnancy, third trimester: Secondary | ICD-10-CM

## 2018-11-30 DIAGNOSIS — O24419 Gestational diabetes mellitus in pregnancy, unspecified control: Secondary | ICD-10-CM

## 2018-11-30 DIAGNOSIS — Z3A37 37 weeks gestation of pregnancy: Secondary | ICD-10-CM

## 2018-11-30 DIAGNOSIS — B951 Streptococcus, group B, as the cause of diseases classified elsewhere: Secondary | ICD-10-CM

## 2018-11-30 NOTE — Progress Notes (Signed)
   TELEHEALTH VIRTUAL OBSTETRICS PRENATAL VISIT ENCOUNTER NOTE  I connected with Zoe Jensen on 11/30/18 at  2:00 PM EDT by WebEx at home and verified that I am speaking with the correct person using two identifiers.   I discussed the limitations, risks, security and privacy concerns of performing an evaluation and management service by telephone and the availability of in person appointments. I also discussed with the patient that there may be a patient responsible charge related to this service. The patient expressed understanding and agreed to proceed. Subjective:  Zoe Jensen is a 28 y.o. G3P2002 at [redacted]w[redacted]d being seen today for ongoing prenatal care.  She is currently monitored for the following issues for this high-risk pregnancy and has Supervision of normal pregnancy; Muscle spasm; Gestational diabetes mellitus (GDM) affecting pregnancy, antepartum; Abnormal glucose tolerance test (GTT) during pregnancy, antepartum; and Positive GBS test on their problem list.  Patient reports general discomforts of pregnancy.  Reports fetal movement. Contractions: Irritability. Vag. Bleeding: None.  Movement: Present. Denies any contractions, bleeding or leaking of fluid.   The following portions of the patient's history were reviewed and updated as appropriate: allergies, current medications, past family history, past medical history, past social history, past surgical history and problem list.   Objective:  There were no vitals filed for this visit.  Fetal Status:     Movement: Present     General:  Alert, oriented and cooperative. Patient is in no acute distress.  Respiratory: Normal respiratory effort, no problems with respiration noted  Mental Status: Normal mood and affect. Normal behavior. Normal judgment and thought content.  Rest of physical exam deferred due to type of encounter  Assessment and Plan:  Pregnancy: G3P2002 at [redacted]w[redacted]d 1. Encounter for supervision of other normal pregnancy  in third trimester Stable   2. Gestational diabetes mellitus (GDM) affecting pregnancy, antepartum CBG in goal range per pt Continue with diet U/S for growth next week  3. Positive GBS test Tx while in labor  Term labor symptoms and general obstetric precautions including but not limited to vaginal bleeding, contractions, leaking of fluid and fetal movement were reviewed in detail with the patient. I discussed the assessment and treatment plan with the patient. The patient was provided an opportunity to ask questions and all were answered. The patient agreed with the plan and demonstrated an understanding of the instructions. The patient was advised to call back or seek an in-person office evaluation/go to [facility] at [facility] for any urgent or concerning symptoms. Please refer to After Visit Summary for other counseling recommendations.   I provided 11 minutes of face-to-face via WebEx time during this encounter.  Return in about 1 week (around 12/07/2018) for OB visit, televisit.  Future Appointments  Date Time Provider Department Center  12/07/2018  9:15 AM [facility] [place] 4 [facility]    Hermina Staggers, MD [facility], [facility]

## 2018-11-30 NOTE — Progress Notes (Signed)
ROB GBS positive on 11/16/18 Pt wanted to discuss when in office appt will be.   Pt states sugars have been fine.

## 2018-12-07 ENCOUNTER — Ambulatory Visit (HOSPITAL_BASED_OUTPATIENT_CLINIC_OR_DEPARTMENT_OTHER)
Admission: RE | Admit: 2018-12-07 | Discharge: 2018-12-07 | Disposition: A | Discharge status: Home / Self Care | Payer: Medicaid Other | Source: Ambulatory Visit | Attending: Obstetrics and Gynecology | Admitting: Obstetrics and Gynecology

## 2018-12-07 ENCOUNTER — Other Ambulatory Visit: Payer: Self-pay

## 2018-12-07 ENCOUNTER — Ambulatory Visit (HOSPITAL_COMMUNITY): Payer: Medicaid Other | Admitting: *Deleted

## 2018-12-07 ENCOUNTER — Encounter (HOSPITAL_COMMUNITY): Payer: Self-pay

## 2018-12-07 VITALS — BP 111/64 | HR 88 | Temp 97.9°F

## 2018-12-07 DIAGNOSIS — O24410 Gestational diabetes mellitus in pregnancy, diet controlled: Secondary | ICD-10-CM | POA: Insufficient documentation

## 2018-12-07 DIAGNOSIS — Z3A38 38 weeks gestation of pregnancy: Secondary | ICD-10-CM

## 2018-12-07 DIAGNOSIS — Z362 Encounter for other antenatal screening follow-up: Secondary | ICD-10-CM

## 2018-12-07 DIAGNOSIS — O99213 Obesity complicating pregnancy, third trimester: Secondary | ICD-10-CM | POA: Diagnosis not present

## 2018-12-08 ENCOUNTER — Ambulatory Visit (INDEPENDENT_AMBULATORY_CARE_PROVIDER_SITE_OTHER): Payer: Medicaid Other | Admitting: Obstetrics & Gynecology

## 2018-12-08 ENCOUNTER — Other Ambulatory Visit: Payer: Self-pay

## 2018-12-08 ENCOUNTER — Inpatient Hospital Stay (HOSPITAL_COMMUNITY)
Admission: AD | Admit: 2018-12-08 | Discharge: 2018-12-11 | DRG: 807 | Disposition: A | Discharge status: Home / Self Care | Payer: Medicaid Other | Attending: Obstetrics & Gynecology | Admitting: Obstetrics & Gynecology

## 2018-12-08 DIAGNOSIS — Z3A39 39 weeks gestation of pregnancy: Secondary | ICD-10-CM | POA: Diagnosis not present

## 2018-12-08 DIAGNOSIS — D649 Anemia, unspecified: Secondary | ICD-10-CM | POA: Diagnosis not present

## 2018-12-08 DIAGNOSIS — O99214 Obesity complicating childbirth: Secondary | ICD-10-CM | POA: Diagnosis present

## 2018-12-08 DIAGNOSIS — Z3483 Encounter for supervision of other normal pregnancy, third trimester: Secondary | ICD-10-CM

## 2018-12-08 DIAGNOSIS — O4290 Premature rupture of membranes, unspecified as to length of time between rupture and onset of labor, unspecified weeks of gestation: Secondary | ICD-10-CM | POA: Diagnosis present

## 2018-12-08 DIAGNOSIS — O9902 Anemia complicating childbirth: Secondary | ICD-10-CM | POA: Diagnosis present

## 2018-12-08 DIAGNOSIS — Z87891 Personal history of nicotine dependence: Secondary | ICD-10-CM | POA: Diagnosis not present

## 2018-12-08 DIAGNOSIS — O24419 Gestational diabetes mellitus in pregnancy, unspecified control: Secondary | ICD-10-CM

## 2018-12-08 DIAGNOSIS — Z1159 Encounter for screening for other viral diseases: Secondary | ICD-10-CM

## 2018-12-08 DIAGNOSIS — O24420 Gestational diabetes mellitus in childbirth, diet controlled: Principal | ICD-10-CM | POA: Diagnosis present

## 2018-12-08 DIAGNOSIS — O99824 Streptococcus B carrier state complicating childbirth: Secondary | ICD-10-CM | POA: Diagnosis present

## 2018-12-08 DIAGNOSIS — O99810 Abnormal glucose complicating pregnancy: Secondary | ICD-10-CM | POA: Diagnosis present

## 2018-12-08 DIAGNOSIS — B951 Streptococcus, group B, as the cause of diseases classified elsewhere: Secondary | ICD-10-CM | POA: Diagnosis present

## 2018-12-08 NOTE — Progress Notes (Signed)
   TELEHEALTH VIRTUAL OBSTETRICS VISIT ENCOUNTER NOTE  I connected with Zoe Jensen on 12/08/18 at  2:30 PM EDT by telephone at home and verified that I am speaking with the correct person using two identifiers.   I discussed the limitations, risks, security and privacy concerns of performing an evaluation and management service by telephone and the availability of in person appointments. I also discussed with the patient that there may be a patient responsible charge related to this service. The patient expressed understanding and agreed to proceed.  Subjective:  Zoe Jensen is a 28 y.o. G3P2002 at [redacted]w[redacted]d being followed for ongoing prenatal care.  She is currently monitored for the following issues for this high-risk pregnancy and has Supervision of normal pregnancy; Muscle spasm; Gestational diabetes mellitus (GDM) affecting pregnancy, antepartum; Abnormal glucose tolerance test (GTT) during pregnancy, antepartum; and Positive GBS test on their problem list.  Patient reports occasional contractions. Reports fetal movement. Denies any contractions, bleeding or leaking of fluid.   The following portions of the patient's history were reviewed and updated as appropriate: allergies, current medications, past family history, past medical history, past social history, past surgical history and problem list.   Objective:   :  Alert, oriented and cooperative.   Mental Status: Normal mood and affect perceived. Normal judgment and thought content.  Rest of physical exam deferred due to type of encounter  Assessment and Plan:  Pregnancy: G3P2002 at [redacted]w[redacted]d 1. Encounter for supervision of other normal pregnancy in third trimester IOL 40 weeks  2. Gestational diabetes mellitus (GDM) affecting pregnancy, antepartum FBS<90 and PP<120 reported  Term labor symptoms and general obstetric precautions including but not limited to vaginal bleeding, contractions, leaking of fluid and fetal movement were  reviewed in detail with the patient.  I discussed the assessment and treatment plan with the patient. The patient was provided an opportunity to ask questions and all were answered. The patient agreed with the plan and demonstrated an understanding of the instructions. The patient was advised to call back or seek an in-person office evaluation/go to [facility]U at [facility] for any urgent or concerning symptoms. Please refer to After Visit Summary for other counseling recommendations.   I provided 10 minutes of non-face-to-face time during this encounter.  Return in about 6 days (around 12/14/2018) for Foley.  No future appointments.  Scheryl Darter, MD Center for [facility], [facility]

## 2018-12-08 NOTE — Patient Instructions (Signed)
Labor Induction    Labor induction is when steps are taken to cause a pregnant woman to begin the labor process. Most women go into labor on their own between 37 weeks and 42 weeks of pregnancy. When this does not happen or when there is a medical need for labor to begin, steps may be taken to induce labor. Labor induction causes a pregnant woman's uterus to contract. It also causes the cervix to soften (ripen), open (dilate), and thin out (efface). Usually, labor is not induced before 39 weeks of pregnancy unless there is a medical reason to do so. Your health care provider will determine if labor induction is needed.  Before inducing labor, your health care provider will consider a number of factors, including:  · Your medical condition and your baby's.  · How many weeks along you are in your pregnancy.  · How mature your baby's lungs are.  · The condition of your cervix.  · The position of your baby.  · The size of your birth canal.  What are some reasons for labor induction?  Labor may be induced if:  · Your health or your baby's health is at risk.  · Your pregnancy is overdue by 1 week or more.  · Your water breaks but labor does not start on its own.  · There is a low amount of amniotic fluid around your baby.  You may also choose (elect) to have labor induced at a certain time. Generally, elective labor induction is done no earlier than 39 weeks of pregnancy.  What methods are used for labor induction?  Methods used for labor induction include:  · Prostaglandin medicine. This medicine starts contractions and causes the cervix to dilate and ripen. It can be taken by mouth (orally) or by being inserted into the vagina (suppository).  · Inserting a small, thin tube (catheter) with a balloon into the vagina and then expanding the balloon with water to dilate the cervix.  · Stripping the membranes. In this method, your health care provider gently separates amniotic sac tissue from the cervix. This causes the  cervix to stretch, which in turn causes the release of a hormone called progesterone. The hormone causes the uterus to contract. This procedure is often done during an office visit, after which you will be sent home to wait for contractions to begin.  · Breaking the water. In this method, your health care provider uses a small instrument to make a small hole in the amniotic sac. This eventually causes the amniotic sac to break. Contractions should begin after a few hours.  · Medicine to trigger or strengthen contractions. This medicine is given through an IV that is inserted into a vein in your arm.  Except for membrane stripping, which can be done in a clinic, labor induction is done in the hospital so that you and your baby can be carefully monitored.  How long does it take for labor to be induced?  The length of time it takes to induce labor depends on how ready your body is for labor. Some inductions can take up to 2-3 days, while others may take less than a day. Induction may take longer if:  · You are induced early in your pregnancy.  · It is your first pregnancy.  · Your cervix is not ready.  What are some risks associated with labor induction?  Some risks associated with labor induction include:  · Changes in fetal heart rate, such as being too   high, too low, or irregular (erratic).  · Failed induction.  · Infection in the mother or the baby.  · Increased risk of having a cesarean delivery.  · Fetal death.  · Breaking off (abruption) of the placenta from the uterus (rare).  · Rupture of the uterus (very rare).  When induction is needed for medical reasons, the benefits of induction generally outweigh the risks.  What are some reasons for not inducing labor?  Labor induction should not be done if:  · Your baby does not tolerate contractions.  · You have had previous surgeries on your uterus, such as a myomectomy, removal of fibroids, or a vertical scar from a previous cesarean delivery.  · Your placenta lies  very low in your uterus and blocks the opening of the cervix (placenta previa).  · Your baby is not in a head-down position.  · The umbilical cord drops down into the birth canal in front of the baby.  · There are unusual circumstances, such as the baby being very early (premature).  · You have had more than 2 previous cesarean deliveries.  Summary  · Labor induction is when steps are taken to cause a pregnant woman to begin the labor process.  · Labor induction causes a pregnant woman's uterus to contract. It also causes the cervix to ripen, dilate, and efface.  · Labor is not induced before 39 weeks of pregnancy unless there is a medical reason to do so.  · When induction is needed for medical reasons, the benefits of induction generally outweigh the risks.  This information is not intended to replace advice given to you by your health care provider. Make sure you discuss any questions you have with your health care provider.  Document Released: 12/03/2006 Document Revised: 08/27/2016 Document Reviewed: 08/27/2016  Elsevier Interactive Patient Education © 2019 Elsevier Inc.

## 2018-12-08 NOTE — Progress Notes (Signed)
Pt states she was not made aware of BV results pt she saw results in Mychart.

## 2018-12-09 ENCOUNTER — Inpatient Hospital Stay (HOSPITAL_COMMUNITY): Payer: Medicaid Other | Admitting: Anesthesiology

## 2018-12-09 ENCOUNTER — Encounter (HOSPITAL_COMMUNITY): Payer: Self-pay | Admitting: Anesthesiology

## 2018-12-09 DIAGNOSIS — O24420 Gestational diabetes mellitus in childbirth, diet controlled: Secondary | ICD-10-CM

## 2018-12-09 DIAGNOSIS — O4290 Premature rupture of membranes, unspecified as to length of time between rupture and onset of labor, unspecified weeks of gestation: Secondary | ICD-10-CM | POA: Diagnosis present

## 2018-12-09 DIAGNOSIS — Z3A39 39 weeks gestation of pregnancy: Secondary | ICD-10-CM

## 2018-12-09 LAB — TYPE AND SCREEN
ABO/RH(D): A POS
Antibody Screen: NEGATIVE

## 2018-12-09 LAB — CBC
HCT: 32.6 % — ABNORMAL LOW (ref 36.0–46.0)
Hemoglobin: 10.9 g/dL — ABNORMAL LOW (ref 12.0–15.0)
MCH: 29.1 pg (ref 26.0–34.0)
MCHC: 33.4 g/dL (ref 30.0–36.0)
MCV: 86.9 fL (ref 80.0–100.0)
Platelets: 170 10*3/uL (ref 150–400)
RBC: 3.75 MIL/uL — ABNORMAL LOW (ref 3.87–5.11)
RDW: 12.9 % (ref 11.5–15.5)
WBC: 6.5 10*3/uL (ref 4.0–10.5)
nRBC: 0 % (ref 0.0–0.2)

## 2018-12-09 LAB — GLUCOSE, CAPILLARY
Glucose-Capillary: 74 mg/dL (ref 70–99)
Glucose-Capillary: 119 mg/dL — ABNORMAL HIGH (ref 70–99)
Glucose-Capillary: 93 mg/dL (ref 70–99)

## 2018-12-09 LAB — RPR: RPR Ser Ql: NONREACTIVE

## 2018-12-09 LAB — ABO/RH: ABO/RH(D): A POS

## 2018-12-09 LAB — SARS CORONAVIRUS 2 BY RT PCR (HOSPITAL ORDER, PERFORMED IN ~~LOC~~ HOSPITAL LAB): SARS Coronavirus 2: NEGATIVE

## 2018-12-09 MED ORDER — LACTATED RINGERS IV SOLN
INTRAVENOUS | Status: DC
  Administered 2018-12-09 (×2): via INTRAVENOUS

## 2018-12-09 MED ORDER — SIMETHICONE 80 MG PO CHEW: 80.0000 mg | CHEWABLE_TABLET | ORAL | Status: DC | PRN

## 2018-12-09 MED ORDER — OXYTOCIN 40 UNITS IN NORMAL SALINE INFUSION - SIMPLE MED
2.5000 [IU]/h | INTRAVENOUS | Status: DC
  Filled 2018-12-09: qty 1000

## 2018-12-09 MED ORDER — ONDANSETRON HCL 4 MG/2ML IJ SOLN: 4.0000 mg | INTRAMUSCULAR | Status: DC | PRN

## 2018-12-09 MED ORDER — OXYCODONE HCL 5 MG PO TABS
5.0000 mg | ORAL_TABLET | ORAL | Status: DC | PRN
  Administered 2018-12-10 – 2018-12-11 (×3): 5 mg via ORAL
  Filled 2018-12-09 (×4): qty 1

## 2018-12-09 MED ORDER — DIBUCAINE (PERIANAL) 1 % EX OINT: 1.0000 "application " | TOPICAL_OINTMENT | CUTANEOUS | Status: DC | PRN

## 2018-12-09 MED ORDER — PRENATAL MULTIVITAMIN CH
1.0000 | ORAL_TABLET | Freq: Every day | ORAL | Status: DC
  Administered 2018-12-09 – 2018-12-11 (×3): 1 via ORAL
  Filled 2018-12-09 (×3): qty 1

## 2018-12-09 MED ORDER — SODIUM CHLORIDE 0.9 % IV SOLN
2.0000 g | Freq: Four times a day (QID) | INTRAVENOUS | Status: DC
  Administered 2018-12-09: 08:00:00 2 g via INTRAVENOUS
  Filled 2018-12-09 (×2): qty 2000

## 2018-12-09 MED ORDER — DIPHENHYDRAMINE HCL 50 MG/ML IJ SOLN
INTRAMUSCULAR | Status: AC
  Filled 2018-12-09: qty 1

## 2018-12-09 MED ORDER — GENTAMICIN SULFATE 40 MG/ML IJ SOLN
5.0000 mg/kg | INTRAVENOUS | Status: DC
  Administered 2018-12-09: 270 mg via INTRAVENOUS
  Filled 2018-12-09: qty 6.75

## 2018-12-09 MED ORDER — OXYCODONE-ACETAMINOPHEN 5-325 MG PO TABS: 1.0000 | ORAL_TABLET | ORAL | Status: DC | PRN

## 2018-12-09 MED ORDER — WITCH HAZEL-GLYCERIN EX PADS: 1.0000 "application " | MEDICATED_PAD | CUTANEOUS | Status: DC | PRN

## 2018-12-09 MED ORDER — ONDANSETRON HCL 4 MG PO TABS: 4.0000 mg | ORAL_TABLET | ORAL | Status: DC | PRN

## 2018-12-09 MED ORDER — ONDANSETRON HCL 4 MG/2ML IJ SOLN: 4.0000 mg | Freq: Four times a day (QID) | INTRAMUSCULAR | Status: DC | PRN

## 2018-12-09 MED ORDER — EPHEDRINE 5 MG/ML INJ: 10.0000 mg | INTRAVENOUS | Status: DC | PRN

## 2018-12-09 MED ORDER — SOD CITRATE-CITRIC ACID 500-334 MG/5ML PO SOLN: 30.0000 mL | ORAL | Status: DC | PRN

## 2018-12-09 MED ORDER — TETANUS-DIPHTH-ACELL PERTUSSIS 5-2.5-18.5 LF-MCG/0.5 IM SUSP: 0.5000 mL | Freq: Once | INTRAMUSCULAR | Status: DC

## 2018-12-09 MED ORDER — FENTANYL-BUPIVACAINE-NACL 0.5-0.125-0.9 MG/250ML-% EP SOLN
12.0000 mL/h | EPIDURAL | Status: DC | PRN
  Filled 2018-12-09: qty 250

## 2018-12-09 MED ORDER — SENNOSIDES-DOCUSATE SODIUM 8.6-50 MG PO TABS
2.0000 | ORAL_TABLET | ORAL | Status: DC
  Administered 2018-12-09 – 2018-12-10 (×2): 2 via ORAL
  Filled 2018-12-09 (×2): qty 2

## 2018-12-09 MED ORDER — FENTANYL CITRATE (PF) 100 MCG/2ML IJ SOLN: 100.0000 ug | INTRAMUSCULAR | Status: DC | PRN

## 2018-12-09 MED ORDER — DIPHENHYDRAMINE HCL 50 MG/ML IJ SOLN
25.0000 mg | Freq: Once | INTRAMUSCULAR | Status: AC
  Administered 2018-12-09: 07:00:00 25 mg via INTRAVENOUS

## 2018-12-09 MED ORDER — DIPHENHYDRAMINE HCL 50 MG/ML IJ SOLN
12.5000 mg | INTRAMUSCULAR | Status: DC | PRN
  Filled 2018-12-09: qty 1

## 2018-12-09 MED ORDER — LACTATED RINGERS IV SOLN: 500.0000 mL | Freq: Once | INTRAVENOUS | Status: DC

## 2018-12-09 MED ORDER — COCONUT OIL OIL
1.0000 "application " | TOPICAL_OIL | Status: DC | PRN
  Administered 2018-12-09: 1 via TOPICAL

## 2018-12-09 MED ORDER — ACETAMINOPHEN 325 MG PO TABS
650.0000 mg | ORAL_TABLET | ORAL | Status: DC | PRN
  Administered 2018-12-09 – 2018-12-11 (×3): 650 mg via ORAL
  Filled 2018-12-09 (×3): qty 2

## 2018-12-09 MED ORDER — OXYCODONE-ACETAMINOPHEN 5-325 MG PO TABS: 2.0000 | ORAL_TABLET | ORAL | Status: DC | PRN

## 2018-12-09 MED ORDER — OXYTOCIN BOLUS FROM INFUSION
500.0000 mL | Freq: Once | INTRAVENOUS | Status: AC
  Administered 2018-12-09: 10:00:00 500 mL via INTRAVENOUS

## 2018-12-09 MED ORDER — LIDOCAINE HCL (PF) 1 % IJ SOLN: 30.0000 mL | INTRAMUSCULAR | Status: DC | PRN

## 2018-12-09 MED ORDER — ACETAMINOPHEN 325 MG PO TABS
650.0000 mg | ORAL_TABLET | ORAL | Status: DC | PRN
  Administered 2018-12-09: 08:00:00 650 mg via ORAL
  Filled 2018-12-09: qty 2

## 2018-12-09 MED ORDER — PHENYLEPHRINE 40 MCG/ML (10ML) SYRINGE FOR IV PUSH (FOR BLOOD PRESSURE SUPPORT)
80.0000 ug | PREFILLED_SYRINGE | INTRAVENOUS | Status: DC | PRN
  Filled 2018-12-09: qty 10

## 2018-12-09 MED ORDER — ZOLPIDEM TARTRATE 5 MG PO TABS: 5.0000 mg | ORAL_TABLET | Freq: Every evening | ORAL | Status: DC | PRN

## 2018-12-09 MED ORDER — LIDOCAINE HCL (PF) 1 % IJ SOLN
INTRAMUSCULAR | Status: DC | PRN
  Administered 2018-12-09: 4 mL via EPIDURAL
  Administered 2018-12-09: 3 mL via EPIDURAL

## 2018-12-09 MED ORDER — LACTATED RINGERS IV SOLN
500.0000 mL | INTRAVENOUS | Status: DC | PRN
  Administered 2018-12-09: 1000 mL via INTRAVENOUS
  Administered 2018-12-09: 500 mL via INTRAVENOUS

## 2018-12-09 MED ORDER — PENICILLIN G 3 MILLION UNITS IVPB - SIMPLE MED
3.0000 10*6.[IU] | INTRAVENOUS | Status: DC
  Administered 2018-12-09: 05:00:00 3 10*6.[IU] via INTRAVENOUS
  Filled 2018-12-09: qty 100

## 2018-12-09 MED ORDER — DIPHENHYDRAMINE HCL 25 MG PO CAPS: 25.0000 mg | ORAL_CAPSULE | Freq: Four times a day (QID) | ORAL | Status: DC | PRN

## 2018-12-09 MED ORDER — LACTATED RINGERS IV SOLN
500.0000 mL | Freq: Once | INTRAVENOUS | Status: AC
  Administered 2018-12-09: 06:00:00 500 mL via INTRAVENOUS

## 2018-12-09 MED ORDER — SODIUM CHLORIDE 0.9 % IV SOLN
5.0000 10*6.[IU] | Freq: Once | INTRAVENOUS | Status: AC
  Administered 2018-12-09: 01:00:00 5 10*6.[IU] via INTRAVENOUS
  Filled 2018-12-09: qty 5

## 2018-12-09 MED ORDER — OXYCODONE HCL 5 MG PO TABS: 10.0000 mg | ORAL_TABLET | ORAL | Status: DC | PRN

## 2018-12-09 MED ORDER — PHENYLEPHRINE 40 MCG/ML (10ML) SYRINGE FOR IV PUSH (FOR BLOOD PRESSURE SUPPORT)
80.0000 ug | PREFILLED_SYRINGE | INTRAVENOUS | Status: DC | PRN
  Administered 2018-12-09 (×2): 80 ug via INTRAVENOUS

## 2018-12-09 MED ORDER — BENZOCAINE-MENTHOL 20-0.5 % EX AERO: 1.0000 "application " | INHALATION_SPRAY | CUTANEOUS | Status: DC | PRN

## 2018-12-09 MED ORDER — IBUPROFEN 600 MG PO TABS
600.0000 mg | ORAL_TABLET | Freq: Four times a day (QID) | ORAL | Status: DC
  Administered 2018-12-09 – 2018-12-11 (×9): 600 mg via ORAL
  Filled 2018-12-09 (×9): qty 1

## 2018-12-09 MED ORDER — SODIUM CHLORIDE (PF) 0.9 % IJ SOLN
INTRAMUSCULAR | Status: DC | PRN
  Administered 2018-12-09: 10.5 mL/h via EPIDURAL

## 2018-12-09 NOTE — Anesthesia Preprocedure Evaluation (Signed)
Anesthesia Evaluation  Patient identified by MRN, date of birth, ID band Patient awake    Reviewed: Allergy & Precautions, Patient's Chart, lab work & pertinent test results  Airway Mallampati: II  TM Distance: >3 FB Neck ROM: Full    Dental no notable dental hx. (+) Teeth Intact   Pulmonary former smoker,    Pulmonary exam normal breath sounds clear to auscultation       Cardiovascular negative cardio ROS Normal cardiovascular exam Rhythm:Regular Rate:Normal     Neuro/Psych negative neurological ROS  negative psych ROS   GI/Hepatic Neg liver ROS, GERD  Controlled and Medicated,  Endo/Other  diabetes, Well Controlled, GestationalObesity  Renal/GU negative Renal ROS  negative genitourinary   Musculoskeletal negative musculoskeletal ROS (+)   Abdominal (+) + obese,   Peds  Hematology  (+) anemia ,   Anesthesia Other Findings   Reproductive/Obstetrics (+) Pregnancy                             Anesthesia Physical Anesthesia Plan  ASA: II  Anesthesia Plan: Epidural   Post-op Pain Management:    Induction:   PONV Risk Score and Plan:   Airway Management Planned: Natural Airway  Additional Equipment:   Intra-op Plan:   Post-operative Plan:   Informed Consent: I have reviewed the patients History and Physical, chart, labs and discussed the procedure including the risks, benefits and alternatives for the proposed anesthesia with the patient or authorized representative who has indicated his/her understanding and acceptance.       Plan Discussed with: Anesthesiologist  Anesthesia Plan Comments:         Anesthesia Quick Evaluation

## 2018-12-09 NOTE — H&P (Signed)
LABOR AND DELIVERY ADMISSION HISTORY AND PHYSICAL NOTE  Zoe Jensen is a 28 y.o. female G3P2002 with IUP at 28w1dby LMP presenting for SROM at 2245 on 5/13 with reported clear fluid with slight pink tinge.  She reports positive fetal movement. She denies vaginal bleeding.  [facility] Pregnancy complications:  - AE2AST EFW 3315g, 47% on 5/12  Past Medical History: Past Medical History:  Diagnosis Date  . Gestational diabetes     Past Surgical History: Past Surgical History:  Procedure Laterality Date  . NO PAST SURGERIES      Obstetrical History: OB History    Gravida  3   Para  2   Term  2   Preterm      AB      Living  2     SAB      TAB      Ectopic      Multiple      Live Births  2           Social History: Social History   Socioeconomic History  . Marital status: Married    Spouse name: Not on file  . Number of children: Not on file  . Years of education: Not on file  . Highest education level: Not on file  Occupational History  . Not on file  Social Needs  . Financial resource strain: Not on file  . Food insecurity:    Worry: Not on file    Inability: Not on file  . Transportation needs:    Medical: Not on file    Non-medical: Not on file  Tobacco Use  . Smoking status: Former SResearch scientist (life sciences) . Smokeless tobacco: Never Used  Substance and Sexual Activity  . Alcohol use: Not Currently  . Drug use: Not Currently  . Sexual activity: Not Currently    Birth control/protection: None  Lifestyle  . Physical activity:    Days per week: Not on file    Minutes per session: Not on file  . Stress: Not on file  Relationships  . Social connections:    Talks on phone: Not on file    Gets together: Not on file    Attends religious service: Not on file    Active member of club or organization: Not on file    Attends meetings of clubs or organizations: Not on file    Relationship status: Not on file  Other  Topics Concern  . Not on file  Social History Narrative  . Not on file    Family History: Family History  Problem Relation Age of Onset  . Stroke Mother   . Hypertension Mother   . Diabetes Mother   . Hypertension Father     Allergies: No Known Allergies  Medications Prior to Admission  Medication Sig Dispense Refill Last Dose  . ACCU-CHEK FASTCLIX LANCETS MISC 1 Units by Percutaneous route 4 (four) times daily. 100 each 12 Taking  . ACCU-CHEK FASTCLIX LANCETS MISC 1 Device by Percutaneous route 4 (four) times daily. 100 each 12 Taking  . Blood Glucose Monitoring Suppl (ACCU-CHEK GUIDE) w/Device KIT 1 kit by Does not apply route 4 (four) times daily. 1 kit 0 Taking  . Blood Glucose Monitoring Suppl (ACCU-CHEK NANO SMARTVIEW) w/Device KIT 1 kit by Subdermal route as directed. Check blood sugars for fasting, and two hours after breakfast, lunch and dinner (4 checks daily) 1 kit 0 Taking  . calcium carbonate (TUMS - DOSED IN  MG ELEMENTAL CALCIUM) 500 MG chewable tablet Chew 1 tablet by mouth daily.   Taking  . clobetasol ointment (TEMOVATE) 9.55 % Apply 1 application topically 2 (two) times daily. Apply to affected area 30 g 2 Taking  . cyclobenzaprine (FLEXERIL) 10 MG tablet Take 1 tablet (10 mg total) by mouth every 8 (eight) hours as needed for muscle spasms. (Patient not taking: Reported on 12/07/2018) 30 tablet 1 Not Taking  . glucose blood (ACCU-CHEK GUIDE) test strip 4 times daily 100 each 12 Taking  . glucose blood (ACCU-CHEK SMARTVIEW) test strip Use as instructed to check blood sugars 100 each 12 Taking  . Prenatal Vit-Fe Fumarate-FA (PRENATAL MULTIVITAMIN) TABS tablet Take 1 tablet by mouth daily at 12 noon.   Taking     Review of Systems  All systems reviewed and negative except as stated in HPI  Physical Exam Blood pressure 130/70, pulse 100, temperature 98.4 F (36.9 C), temperature source Oral, resp. rate 18, last menstrual period 03/10/2018, SpO2 100 %. General  appearance: alert, oriented, NAD Lungs: normal respiratory effort Heart: regular rate Abdomen: soft, non-tender; gravid, FH appropriate for GA Extremities: No calf swelling or tenderness Presentation: cephalic Fetal monitoring: 140 bpm, moderate variability, +acels, no decels  Uterine activity: q2-3 min   Dilation: 4.5 Effacement (%): 80 Station: -1  Prenatal labs: ABO, Rh: --/--/PENDING (05/14 0025) Antibody: PENDING (05/14 0025) Rubella: 7.59 (10/28 1041) RPR: Non Reactive (02/25 0950)  HBsAg: Negative (10/28 1041)  HIV: Non Reactive (02/25 0950)  GC/Chlamydia: Negative  GBS: Positive (04/21 0201)  2-hr GTT: Abnormal  Genetic screening:  Normal  Anatomy US: Normal   Prenatal Transfer Tool  Maternal Diabetes: Yes:  Diabetes Type:  Diet controlled Genetic Screening: Normal Maternal Ultrasounds/Referrals: Normal Fetal Ultrasounds or other Referrals:  Referred to Materal Fetal Medicine  Maternal Substance Abuse:  No Significant Maternal Medications:  None Significant Maternal Lab Results: Lab values include: Group B Strep positive  Results for orders placed or performed during the hospital encounter of 12/08/18 (from the past 24 hour(s))  Type and screen [place]   Collection Time: 12/09/18 12:25 AM  Result Value Ref Range   ABO/RH(D) PENDING    Antibody Screen PENDING    Sample Expiration      12/12/2018,2359 Performed at [facility], [address] [place], [place] [address]     Patient Active Problem List   Diagnosis Date Noted  . Amniotic fluid leaking 12/09/2018  . Positive GBS test 11/30/2018  . Abnormal glucose tolerance test (GTT) during pregnancy, antepartum 09/29/2018  . Gestational diabetes mellitus (GDM) affecting pregnancy, antepartum 09/23/2018  . Muscle spasm 08/24/2018  . Supervision of normal pregnancy 05/24/2018    Assessment: Zoe Jensen is a 28 y.o. G3P2002 at 14w1dhere for SROM/SOL.   #Labor: Expectant  management. Patient with good contraction pattern.  #Pain: Plans for epidural  #FWB: Cat I  #ID:  GBS+, PCN  #MOF: Breast  #[facility]  #Circ:  Yes, outpatient   CMelina Schools5/14/2020, 12:54 AM

## 2018-12-09 NOTE — Progress Notes (Signed)
ANTIBIOTIC CONSULT NOTE - INITIAL  Pharmacy Consult for Gentamicin Indication: maternal fever  No Known Allergies  Patient Measurements: Height: 4\' 10"  (147.3 cm) Weight: 177 lb (80.3 kg) IBW/kg (Calculated) : 40.9 Adjusted Body Weight: 53 kg  Vital Signs: Temp: 100.2 F (37.9 C) (05/14 0730) Temp Source: Axillary (05/14 0730) BP: 112/61 (05/14 0932) Pulse Rate: 99 (05/14 0932)  Labs: Recent Labs    12/09/18 0025  WBC 6.5  HGB 10.9*  PLT 170   No results for input(s): GENTTROUGH, GENTPEAK, GENTRANDOM in the last 72 hours.   Microbiology: Recent Results (from the past 720 hour(s))  Strep Gp B NAA     Status: Abnormal   Collection Time: 11/16/18  2:01 AM  Result Value Ref Range Status   Strep Gp B NAA Positive (A) Negative Final    Comment: Centers for Disease Control and Prevention (CDC) and American Congress of Obstetricians and Gynecologists (ACOG) guidelines for prevention of perinatal group B streptococcal (GBS) disease specify co-collection of a vaginal and rectal swab specimen to maximize sensitivity of GBS detection. Per the CDC and ACOG, swabbing both the lower vagina and rectum substantially increases the yield of detection compared with sampling the vagina alone. Penicillin G, ampicillin, or cefazolin are indicated for intrapartum prophylaxis of perinatal GBS colonization. Reflex susceptibility testing should be performed prior to use of clindamycin only on GBS isolates from penicillin-allergic women who are considered a high risk for anaphylaxis. Treatment with vancomycin without additional testing is warranted if resistance to clindamycin is noted.   SARS Coronavirus 2 (CEPHEID - Performed in [facility] hospital lab), Hosp Order     Status: None   Collection Time: 12/09/18 12:28 AM  Result Value Ref Range Status   SARS Coronavirus 2 NEGATIVE NEGATIVE Final    Comment: (NOTE) If result is NEGATIVE SARS-CoV-2 target nucleic acids are NOT  DETECTED. The SARS-CoV-2 RNA is generally detectable in upper and lower  respiratory specimens during the acute phase of infection. The lowest  concentration of SARS-CoV-2 viral copies this assay can detect is 250  copies / mL. A negative result does not preclude SARS-CoV-2 infection  and should not be used as the sole basis for treatment or other  patient management decisions.  A negative result may occur with  improper specimen collection / handling, submission of specimen other  than nasopharyngeal swab, presence of viral mutation(s) within the  areas targeted by this assay, and inadequate number of viral copies  (<250 copies / mL). A negative result must be combined with clinical  observations, patient history, and epidemiological information. If result is POSITIVE SARS-CoV-2 target nucleic acids are DETECTED. The SARS-CoV-2 RNA is generally detectable in upper and lower  respiratory specimens dur ing the acute phase of infection.  Positive  results are indicative of active infection with SARS-CoV-2.  Clinical  correlation with patient history and other diagnostic information is  necessary to determine patient infection status.  Positive results do  not rule out bacterial infection or co-infection with other viruses. If result is PRESUMPTIVE POSTIVE SARS-CoV-2 nucleic acids MAY BE PRESENT.   A presumptive positive result was obtained on the submitted specimen  and confirmed on repeat testing.  While 2019 novel coronavirus  (SARS-CoV-2) nucleic acids may be present in the submitted sample  additional confirmatory testing may be necessary for epidemiological  and / or clinical management purposes  to differentiate between  SARS-CoV-2 and other Sarbecovirus currently known to infect humans.  If clinically indicated additional testing with  an alternate test  methodology [phone]([id number]) is advised. The SARS-CoV-2 RNA is generally  detectable in upper and lower respiratory sp ecimens during  the acute  phase of infection. The expected result is Negative. Fact Sheet for Patients:  BoilerBrush.com.cyhttps://www.fda.gov/media/136312/download Fact Sheet for Healthcare Providers: https://pope.com/https://www.fda.gov/media/136313/download This test is not yet approved or cleared by the [place] FDA and has been authorized for detection and/or diagnosis of SARS-CoV-2 by FDA under an Emergency Use Authorization (EUA).  This EUA will remain in effect (meaning this test can be used) for the duration of the COVID-19 declaration under Section 564(b)(1) of the Act, 21 U.S.C. section 360bbb-3(b)(1), unless the authorization is terminated or revoked sooner. Performed at [facility], [address] [place], [place] [address]     Medications:  Ampicillin 2g IV q6h  Assessment: 28 y.o. female G3P2002 at 8524w1d being treated for maternal fever (Tmax 100.2)  Goal of Therapy:  Gentamicin peak 6-8 mg/L and Trough < 1 mg/L  Plan:  Gentamicin 270 mg IV every 24 hrs. Check Scr with next labs if gentamicin continued. Will check gentamicin levels if continued > 72hr or clinically indicated.  Benetta SparVictoria Heyward Douthit 12/09/2018,9:47 AM

## 2018-12-09 NOTE — Progress Notes (Signed)
Zoe Jensen is a 28 y.o. G3P2002 at [redacted]w[redacted]d by LMP admitted for induction of labor due to Gestational diabetes.  Subjective: Pt comfortable with epidural.  S/O in room for support.  Objective: BP 100/86   Pulse (!) 128   Temp 100.2 F (37.9 C) (Axillary)   Resp 18   Ht 4\' 10"  (1.473 m)   Wt 80.3 kg   LMP 03/10/2018 (Exact Date)   SpO2 100%   BMI 36.99 kg/m  I/O last 3 completed shifts: In: -  Out: 500 [Urine:500] Total I/O In: -  Out: 125 [Urine:125]  FHT:  FHR: 140 bpm, variability: moderate,  accelerations:  Present,  decelerations:  Present early and late decels, positive scalp stimulation with cervical exam UC:   irregular, every 1.5-5 minutes SVE:   Dilation: Lip/rim Effacement (%): 80 Station: Plus 2 Exam by:: Misty Stanley CNM   Labs: Lab Results  Component Value Date   WBC 6.5 12/09/2018   HGB 10.9 (L) 12/09/2018   HCT 32.6 (L) 12/09/2018   MCV 86.9 12/09/2018   PLT 170 12/09/2018    Assessment / Plan: Induction of labor due to gestational diabetes,  progressing well on pitocin  Labor: Progressing normally. Pt with swollen anterior lip, no other cervix palpable.  Pt pushed x 2 with provider in room and pushing well with fetal head past cervical lip.  Pt to push with RN, monitor cervix, if increased swelling or problems with descent, will labor down.  Preeclampsia:  n/a Fetal Wellbeing:  Category II Pain Control:  Epidural I/D:  GBS positive Anticipated MOD:  NSVD  Sharen Counter 12/09/2018, 9:32 AM

## 2018-12-09 NOTE — Anesthesia Procedure Notes (Signed)
Epidural Patient location during procedure: OB Start time: 12/09/2018 1:14 AM End time: 12/09/2018 1:21 AM  Staffing Anesthesiologist: Mal Amabile, MD Performed: anesthesiologist   Preanesthetic Checklist Completed: patient identified, site marked, surgical consent, pre-op evaluation, timeout performed, IV checked, risks and benefits discussed and monitors and equipment checked  Epidural Patient position: sitting Prep: site prepped and draped and DuraPrep Patient monitoring: continuous pulse ox and blood pressure Approach: midline Location: L3-L4 Injection technique: LOR air  Needle:  Needle type: Tuohy  Needle gauge: 17 G Needle length: 9 cm and 9 Needle insertion depth: 5 cm cm Catheter type: closed end flexible Catheter size: 19 Gauge Catheter at skin depth: 10 cm Test dose: negative and Other  Assessment Events: blood not aspirated, injection not painful, no injection resistance, negative IV test and no paresthesia  Additional Notes Patient identified. Risks and benefits discussed including failed block, incomplete  Pain control, post dural puncture headache, nerve damage, paralysis, blood pressure Changes, nausea, vomiting, reactions to medications-both toxic and allergic and post Partum back pain. All questions were answered. Patient expressed understanding and wished to proceed. Sterile technique was used throughout procedure. Epidural site was Dressed with sterile barrier dressing. No paresthesias, signs of intravascular injection Or signs of intrathecal spread were encountered.  Patient was more comfortable after the epidural was dosed. Please see RN's note for documentation of vital signs and FHR which are stable. Reason for block:procedure for pain

## 2018-12-09 NOTE — Discharge Summary (Signed)
Postpartum Discharge Summary  Patient Name: Jacqlyn KraussJeanetta Tuel DOB: [birth year] MRN: [id number]  Date of admission: 12/08/2018 Delivering Provider: Sharen CounterLEFTWICH-KIRBY, LISA A   Date of discharge: 12/11/2018  Admitting diagnosis: pregnancy Intrauterine pregnancy: 285w1d     Secondary diagnosis:  Active Problems:   Gestational diabetes mellitus (GDM) affecting pregnancy, antepartum   Abnormal glucose tolerance test (GTT) during pregnancy, antepartum   Positive GBS test   Amniotic fluid leaking   NSVD (normal spontaneous vaginal delivery)  Additional problems: None Discharge diagnosis: Term Pregnancy Delivered                                Postpartum procedures:none Augmentation: none Complications: None  Hospital course:  Onset of Labor With Vaginal Delivery     28 y.o. yo G3P2002 at 2285w1d was admitted in Active Labor on 12/08/2018. Patient had an uncomplicated labor course as follows:  Membrane Rupture Time/Date: 10:45 PM ,12/08/2018   Intrapartum Procedures: Episiotomy: None [1] . Lacerations:  None [1] . Patient had a delivery of a Viable infant. Patient had an uncomplicated postpartum course.  She is ambulating, tolerating a regular diet, passing flatus, and urinating well. Patient is discharged home in stable condition on 12/11/18.  Magnesium Sulfate recieved: No BMZ received: No  Physical exam  Vitals:   12/10/18 0513 12/10/18 1500 12/10/18 2224 12/11/18 0540  BP: 106/70 106/68 119/66 123/75  Pulse: 91 98 79 76  Resp: 17 18 18    Temp: 98.2 F (36.8 C) 98 F (36.7 C) 98.2 F (36.8 C) 98.1 F (36.7 C)  TempSrc: Oral Oral Oral   SpO2: 99%   100%  Weight:      Height:       General: alert, well-appearing, NAD Lochia: appropriate Uterine Fundus: firm Incision: N/A DVT Evaluation: No significant calf/ankle edema  Labs: Lab Results  Component Value Date   WBC 6.5 12/09/2018   HGB 10.9 (L) 12/09/2018   HCT 32.6 (L) 12/09/2018   MCV 86.9 12/09/2018   PLT 170 12/09/2018    No flowsheet data found.  Discharge instruction: per After Visit Summary and "Baby and Me Booklet".  After visit meds:  Allergies as of 12/11/2018   No Known Allergies     Medication List    TAKE these medications   acetaminophen 500 MG tablet Commonly known as:  Tylenol Take 2 tablets (1,000 mg total) by mouth every 6 (six) hours as needed (for pain scale < 4).   ibuprofen 800 MG tablet Commonly known as:  ADVIL Take 1 tablet (800 mg total) by mouth 3 (three) times daily.   senna-docusate 8.6-50 MG tablet Commonly known as:  Senokot-S Take 2 tablets by mouth at bedtime as needed for mild constipation.      Diet: routine diet Activity: Advance as tolerated. Pelvic rest for 6 weeks.   Outpatient follow up:4-6 weeks Follow up Appt: Future Appointments  Date Time Provider Department Center  01/06/2019  9:45 AM Conan Bowensavis, Kelly M, MD [facility] None   Follow up Visit: Follow-up Information    [facility]. Schedule an appointment as soon as possible for a visit.   Specialty:  Obstetrics and Gynecology Why:  You should receive a call to schedule a postpartum follow-up visit. If you do not, please call clinic to schedule a visit in 4-6 weeks.  Contact information: [address], [address] [place] [id number] [phone]  Please schedule this patient for PP visit in: 4 weeks High risk pregnancy complicated by: GDM Delivery mode:  SVD Anticipated Birth Control:  IUD in office PP Procedures needed: none  Schedule Integrated BH visit: no Provider: Any provider  Newborn Data: Live born female  Birth Weight: 6 lb 10 oz (3005 g) APGAR: 7, 9  Newborn Delivery   Birth date/time:  12/09/2018 09:55:00 Delivery type:  Vaginal, Spontaneous    Baby Feeding: Breast Disposition:home with mother  12/11/2018 Tamera Stands, DO

## 2018-12-09 NOTE — Progress Notes (Signed)
LABOR PROGRESS NOTE  Zoe Jensen is a 28 y.o. G3P2002 at [redacted]w[redacted]d  admitted for SROM/SOL.   Subjective: Comfortable with epidural. Not feeling much pressure yet.   Objective: BP 118/63   Pulse (!) 102   Temp 98.6 F (37 C) (Oral)   Resp 12   Ht 4\' 10"  (1.473 m)   Wt 80.3 kg   LMP 03/10/2018 (Exact Date)   SpO2 100%   BMI 36.99 kg/m  or  Vitals:   12/09/18 0300 12/09/18 0330 12/09/18 0405 12/09/18 0430  BP: 113/90 (!) 110/48 (!) 111/57 118/63  Pulse: (!) 115 (!) 101 100 (!) 102  Resp: 13 12 13 12   Temp:    98.6 F (37 C)  TempSrc:    Oral  SpO2:      Weight:   80.3 kg   Height:   4\' 10"  (1.473 m)     Dilation: 8.5 Effacement (%): 80 Cervical Position: Middle Station: -1, 0 Presentation: Vertex Exam by:: Leanna Sato, RN FHT: baseline rate 135 , moderate varibility, +acel, occ variable decel Toco: q1-4 min   Labs: Lab Results  Component Value Date   WBC 6.5 12/09/2018   HGB 10.9 (L) 12/09/2018   HCT 32.6 (L) 12/09/2018   MCV 86.9 12/09/2018   PLT 170 12/09/2018    Patient Active Problem List   Diagnosis Date Noted  . Amniotic fluid leaking 12/09/2018  . Positive GBS test 11/30/2018  . Abnormal glucose tolerance test (GTT) during pregnancy, antepartum 09/29/2018  . Gestational diabetes mellitus (GDM) affecting pregnancy, antepartum 09/23/2018  . Muscle spasm 08/24/2018  . Supervision of normal pregnancy 05/24/2018    Assessment / Plan: 28 y.o. G3P2002 at [redacted]w[redacted]d here for SOL/SROM/   Labor: Patient progressed without augmentation. Expectant management.  Fetal Wellbeing:  Cat II with occasional variable but overall very reassuring with moderate variability, acels, and quick return to baseline  Pain Control:  Epidural in place  Anticipated MOD:  NSVD   [name], D.O. OB Fellow  12/09/2018, 4:35 AM

## 2018-12-09 NOTE — Lactation Note (Signed)
This note was copied from a baby's chart. Lactation Consultation Note  Patient Name: Zoe Jensen KGURK'Y Date: 12/09/2018 Reason for consult: Initial assessment;Term P3, 13 hour female infant, GDM mom, Per mom, she only breastfeed her eldest son for one week and 2nd child for 3 weeks due latch difficulties. Mom feels infant is latching better than her other sons, she breastfeed infant 3 times since delivery for 15 minutes each. LC noticed mom had Lanolin cream on table and pacifier in bassinet.  LC informed Nurse and Mom will use coconut oil for breast soreness,  LC discussed possible risk factors with mom for thrust or yeast with lanolin use. LC discussed possible risk factors of pacifier usage in an  infant less than one month of age.  Mom appeared receptive of information. Mom has mostly been latching infant with football hold and will try cross cradle hold position.  Mom latched infant on right breast, few attempts for  infant sustained latch, LC encouraged mom hold breast in "C or U hold position" to help infant have better latch not be on the tip of mom's nipple. Mom to bring infant towards breast, chin first and infant with a  wider mouth for deeper latch, LC used booklet visuals " Mother and Pecola Leisure  Booklet" looking at cross cradle and football hold positions.  Mom knows to breastfeed according hunger cues, 8 -12 times within 24 hours. Mom will do as much STS as possible. Mom knows to call Nurse or LC if she has any questions, concerns or need assistance with latching infant to breast. [name] hand pump given at mom's request Prn, LC explained how to use hand pump & how to disassemble, clean, & reassemble parts. Mom knows to wear breast shells in bra during the day and not at night to help evert nipple shaft out more.  Mom made aware of O/P services, breastfeeding support groups, community resources, and our phone # for post-discharge questions.  Maternal Data Formula Feeding for  Exclusion: No Has patient been taught Hand Expression?: Yes(colostrum present both breast.) Does the patient have breastfeeding experience prior to this delivery?: Yes  Feeding Feeding Type: Breast Fed  LATCH Score Latch: Repeated attempts needed to sustain latch, nipple held in mouth throughout feeding, stimulation needed to elicit sucking reflex.  Audible Swallowing: A few with stimulation  Type of Nipple: Everted at rest and after stimulation(Nipples little short shafted when compressed.)  Comfort (Breast/Nipple): Soft / non-tender  Hold (Positioning): Assistance needed to correctly position infant at breast and maintain latch.  LATCH Score: 7  Interventions Interventions: Breast feeding basics reviewed;Assisted with latch;Skin to skin;Breast compression;Adjust position;Breast massage;Hand express;Support pillows;Coconut oil;Shells;Hand pump  Lactation Tools Discussed/Used Tools: Shells;Pump Shell Type: (Nipples get short when compressed .) WIC Program: No Pump Review: Setup, frequency, and cleaning;Milk Storage Initiated by:: Danelle Earthly, IBCLC Date initiated:: 12/09/18   Consult Status Consult Status: Follow-up Date: 12/10/18 Follow-up type: In-patient    Danelle Earthly 12/09/2018, 11:48 PM

## 2018-12-10 NOTE — Anesthesia Postprocedure Evaluation (Signed)
Anesthesia Post Note  Patient: Zoe Jensen  Procedure(s) Performed: AN AD HOC LABOR EPIDURAL     Patient location during evaluation: Mother Baby Anesthesia Type: Epidural Level of consciousness: awake and alert, oriented and patient cooperative Pain management: pain level controlled Vital Signs Assessment: post-procedure vital signs reviewed and stable Respiratory status: spontaneous breathing Cardiovascular status: stable Postop Assessment: no headache, epidural receding, patient able to bend at knees, no signs of nausea or vomiting and able to ambulate Anesthetic complications: no Comments: Pain score 4 when breastfeeding.      Last Vitals:  Vitals:   12/10/18 0129 12/10/18 0513  BP: 109/64 106/70  Pulse: 80 91  Resp: 17 17  Temp: 37.1 C 36.8 C  SpO2: 100% 99%    Last Pain:  Vitals:   12/10/18 0513  TempSrc: Oral  PainSc: 4    Pain Goal:                   [facility]

## 2018-12-10 NOTE — Progress Notes (Signed)
Post Partum Day 1 Subjective: up ad lib, voiding, tolerating PO and abdominal pain unrelieved by ibuprofen  Objective: Blood pressure 106/70, pulse 91, temperature 98.2 F (36.8 C), temperature source Oral, resp. rate 17, height 4\' 10"  (1.473 m), weight 80.3 kg, last menstrual period 03/10/2018, SpO2 99 %, unknown if currently breastfeeding.  Physical Exam:  General: alert, cooperative and no distress Lochia: appropriate Uterine Fundus: Firm and moderately tender (had Triple I in labor) Incision: n/a DVT Evaluation: No evidence of DVT seen on physical exam.  Recent Labs    12/09/18 0025  HGB 10.9*  HCT 32.6*    Assessment/Plan: Plan for discharge tomorrow and Breastfeeding  Will add oxy for pain relief. Discussed use sparingly for breakthrough pain and that we would not send her home with it.  The uterine tenderness may be related to Triple I in labor.  Has been afebrile.  Will monitor.   LOS: 2 days   Wynelle Bourgeois 12/10/2018, 6:21 AM

## 2018-12-11 MED ORDER — IBUPROFEN 800 MG PO TABS: 800.0000 mg | ORAL_TABLET | Freq: Three times a day (TID) | ORAL | 0 refills | Status: DC

## 2018-12-11 MED ORDER — SENNOSIDES-DOCUSATE SODIUM 8.6-50 MG PO TABS: 2.0000 | ORAL_TABLET | Freq: Every evening | ORAL | Status: DC | PRN

## 2018-12-11 MED ORDER — ACETAMINOPHEN 500 MG PO TABS: 1000.0000 mg | ORAL_TABLET | Freq: Four times a day (QID) | ORAL | Status: DC | PRN

## 2018-12-11 NOTE — Lactation Note (Signed)
This note was copied from a baby's chart. Lactation Consultation Note  Patient Name: Zoe Jensen EVOJJ'K Date: 12/11/2018 Reason for consult: Follow-up assessment;Term;Infant weight loss(7% weight loss/ see LC note / LC encouraged mom to call with feeding cues for latch score )  Baby is 74 hours old for D/C today  Per mom baby recently fed and asked for formula due to baby not acting satisfied after breast feeding both breast.  LC reviewed doc flow sheets and updated per mom and dad.  The Latch score range has been 5-7. Had not stooled  Since 1400 yesterday and 3 in his life.  LC recommended and encouraged mom to call for [facility] for feeding assessment for Latch Score.  Mom denies sore nipples / just cramping when feeding.   LC reviewed  Sore nipple and engorgement prevention and tx. . Per mom has hand pump for home.  LC recommended prior to latch - breast massage, hand express, pre-pump with hand pump to  Prime the milk ducts to enhance the flow to baby and increase the let down.   Storage of breast milk reviewed.   Mom aware to page for latch assessment.    Maternal Data    Feeding Feeding Type: (per mom recently BF and supplemented due to baby not being satisfied ) Nipple Type: Slow - flow  LATCH Score                   Interventions Interventions: Breast feeding basics reviewed  Lactation Tools Discussed/Used Tools: Pump;Shells(already has both ) Shell Type: Inverted Breast pump type: Manual Pump Review: Milk Storage   Consult Status Consult Status: Follow-up Date: 12/11/18 Follow-up type: In-patient    Matilde Sprang Teige Rountree 12/11/2018, 9:10 AM

## 2018-12-11 NOTE — Lactation Note (Signed)
This note was copied from a baby's chart. Lactation Consultation Note  Patient Name: Zoe Jensen Date: 12/11/2018  P3, 39 hour female infant, weight loss-3%. LC entered room mom and infant asleep.    Maternal Data    Feeding Feeding Type: Breast Fed  LATCH Score Latch: Repeated attempts needed to sustain latch, nipple held in mouth throughout feeding, stimulation needed to elicit sucking reflex.  Audible Swallowing: None  Type of Nipple: Everted at rest and after stimulation  Comfort (Breast/Nipple): Soft / non-tender  Hold (Positioning): Assistance needed to correctly position infant at breast and maintain latch.  LATCH Score: 6  Interventions    Lactation Tools Discussed/Used     Consult Status      Danelle Earthly 12/11/2018, 1:16 AM

## 2018-12-14 ENCOUNTER — Encounter: Payer: Medicaid Other | Admitting: Obstetrics and Gynecology

## 2018-12-15 ENCOUNTER — Inpatient Hospital Stay (HOSPITAL_COMMUNITY): Payer: Medicaid Other

## 2019-01-06 ENCOUNTER — Other Ambulatory Visit: Payer: Self-pay

## 2019-01-06 ENCOUNTER — Ambulatory Visit (INDEPENDENT_AMBULATORY_CARE_PROVIDER_SITE_OTHER): Payer: Medicaid Other | Admitting: Obstetrics and Gynecology

## 2019-01-06 ENCOUNTER — Encounter: Payer: Self-pay | Admitting: Obstetrics and Gynecology

## 2019-01-06 DIAGNOSIS — Z30430 Encounter for insertion of intrauterine contraceptive device: Secondary | ICD-10-CM | POA: Diagnosis not present

## 2019-01-06 DIAGNOSIS — Z3009 Encounter for other general counseling and advice on contraception: Secondary | ICD-10-CM

## 2019-01-06 DIAGNOSIS — Z3202 Encounter for pregnancy test, result negative: Secondary | ICD-10-CM

## 2019-01-06 LAB — POCT URINE PREGNANCY: Preg Test, Ur: NEGATIVE

## 2019-01-06 MED ORDER — LEVONORGESTREL 19.5 MCG/DAY IU IUD
INTRAUTERINE_SYSTEM | Freq: Once | INTRAUTERINE | Status: AC
  Administered 2019-01-06: 11:00:00 via INTRAUTERINE

## 2019-01-06 NOTE — Progress Notes (Signed)
    IUD INSERTION PROCEDURE NOTE  Zoe Jensen is a 28 y.o. [id number] here for Liletta insertion. No GYN concerns.   She was counseled regarding the risks/benefits of IUD including insertion risk of infection, hemorrhage, damage to surrounding tissue and organs, uterine perforation. She was counseled regarding risks of IUD including implantation into uterine wall, migration outside of uterus, possible need for hysteroscopic or laparoscopic removal, ovarian cysts, expulsion. She was advised that risk of pregnancy is low with negative UPT but is not zero and IUD insertion may cause miscarriage. Reviewed that she is also at slightly higher risk for ectopic pregnancy and she should take a pregnancy test if she believes she may be pregnant. She was advised to use backup method of protection for one week. She verbalized understanding of all of the above and consent signed.   Last intercourse was prior to delivery > 1 months ago Last pap smear was on 12/2017 and was negative UPT today: negative  IUD Insertion  Patient identified and an adequate time out was performed. Speculum placed in the vagina. The cervix was cleaned with Betadine x 3 and grasped anteriorly with a single tooth tenaculum.  A uterine sound was used to sound the uterus to 8 cm;  the IUD was then placed per manufacturer's recommendations. Strings trimmed to 3 cm. Tenaculum was removed, good hemostasis noted. Patient tolerated procedure well.   Patient was given post-procedure instructions.  She was reminded to have backup contraception for one week during this transition period between IUDs.  Patient was also asked to check IUD strings periodically and follow up in 4 weeks for IUD check.  Liletta IUD Exp: 12/2021 Lot: [id number]  K. Arvilla Meres, M.D. Attending Center for [employer] Fish farm manager)

## 2019-01-06 NOTE — Progress Notes (Signed)
Administrations This Visit    Levonorgestrel (LILETTA) 19.5 MCG/DAY IUD    Admin Date 01/06/2019 Action Given Dose  Route Intrauterine Administered By Tamela Oddi, RMA

## 2019-01-06 NOTE — Progress Notes (Signed)
Obstetrics/Postpartum Visit  Appointment Date: 01/06/2019  OBGYN Clinic: [facility]  Primary Care Provider: Patient, No Pcp Per  Chief Complaint:  Chief Complaint  Patient presents with  . Postpartum Follow-up    History of Present Illness: Zoe Jensen is a 28 y.o. African-American [phone] (Patient's last menstrual period was 03/10/2018 (exact date).), seen for the above chief complaint. Her past medical history is significant for n/a   She is s/p SVD on 12/09/18 at 39 weeks; she was discharged to home on PPD#2. Pregnancy complicated by gDMA1.  Complains of nothing, she is doing well.   Vaginal bleeding or discharge: No  Breast or formula feeding: bottle Intercourse: No  Contraception: for IUD today PP depression s/s: No  Any bowel or bladder issues: No  Pap smear: no abnormalities (date: 12/2017)  Review of Systems: Positive for n/a.   Her 12 point review of systems is negative or as noted in the History of Present Illness.  Patient Active Problem List   Diagnosis Date Noted  . Amniotic fluid leaking 12/09/2018  . NSVD (normal spontaneous vaginal delivery) 12/09/2018  . Positive GBS test 11/30/2018  . Abnormal glucose tolerance test (GTT) during pregnancy, antepartum 09/29/2018  . Gestational diabetes mellitus (GDM) affecting pregnancy, antepartum 09/23/2018  . Muscle spasm 08/24/2018  . Supervision of normal pregnancy 05/24/2018    Medications We administered Levonorgestrel. Current Outpatient Medications  Medication Sig Dispense Refill  . acetaminophen (TYLENOL) 500 MG tablet Take 2 tablets (1,000 mg total) by mouth every 6 (six) hours as needed (for pain scale < 4). (Patient not taking: Reported on 01/06/2019)    . ibuprofen (ADVIL) 800 MG tablet Take 1 tablet (800 mg total) by mouth 3 (three) times daily. (Patient not taking: Reported on 01/06/2019) 30 tablet 0  . senna-docusate (SENOKOT-S) 8.6-50 MG tablet Take 2 tablets by mouth at bedtime as needed for mild  constipation. (Patient not taking: Reported on 01/06/2019)     No current facility-administered medications for this visit.     Allergies Patient has no known allergies.  Physical Exam:  LMP 03/10/2018 (Exact Date)  There is no height or weight on file to calculate BMI. General appearance: Well nourished, well developed female in no acute distress.  Cardiovascular: regular rate and rhythm Respiratory:  Clear to auscultation bilateral. Normal respiratory effort Abdomen: no masses, hernias; diffusely non tender to palpation, non distended Breasts: not examined. Neuro/Psych:  Normal mood and affect.  Skin:  Warm and dry.   Pelvic exam: is not limited by body habitus EGBUS: within normal limits Vagina: within normal limits and with None blood in the vault, Cervix:  no lesions or cervical motion tenderness   PP Depression Screening:   Edinburgh Postnatal Depression Scale - 01/06/19 0950      Edinburgh Postnatal Depression Scale:  In the Past 7 Days   I have been able to laugh and see the funny side of things.  0    I have looked forward with enjoyment to things.  0    I have blamed myself unnecessarily when things went wrong.  0    I have been anxious or worried for no good reason.  0    I have felt scared or panicky for no good reason.  0    Things have been getting on top of me.  0    I have been so unhappy that I have had difficulty sleeping.  0    I have felt sad or miserable.  0  I have been so unhappy that I have been crying.  0    The thought of harming myself has occurred to me.  0    Edinburgh Postnatal Depression Scale Total  0       Assessment: Patient is a 28 y.o. [id number] who is 4 weeks post partum from a spontaneous vaginal delivery. She is doing well.   Plan:   1. Encounter for IUD insertion Please see IUD insertion note - POCT urine pregnancy  2. Postpartum state Doing well, no issues  3. Encounter for counseling regarding contraception   RTC 1 month  for string check   K. Therese SarahMeryl , M.D. Attending Center for [employer] Midwife(Faculty Practice)

## 2019-02-03 ENCOUNTER — Ambulatory Visit: Payer: Medicaid Other | Admitting: Family Medicine

## 2019-02-08 ENCOUNTER — Other Ambulatory Visit: Payer: Self-pay

## 2019-02-08 ENCOUNTER — Encounter: Payer: Self-pay | Admitting: Certified Nurse Midwife

## 2019-02-08 ENCOUNTER — Ambulatory Visit (INDEPENDENT_AMBULATORY_CARE_PROVIDER_SITE_OTHER): Payer: Medicaid Other | Admitting: Certified Nurse Midwife

## 2019-02-08 VITALS — BP 123/86 | HR 79 | Wt 173.8 lb

## 2019-02-08 DIAGNOSIS — Z30431 Encounter for routine checking of intrauterine contraceptive device: Secondary | ICD-10-CM | POA: Diagnosis not present

## 2019-02-08 NOTE — Progress Notes (Signed)
Pt presents for IUD string check. Pt states that she has no concerns with IUD at this time.

## 2019-02-08 NOTE — Progress Notes (Signed)
GYNECOLOGY OFFICE VISIT NOTE  History:  28 y.o. [id number] here today for IUD string check. Liletta IUD was placed on 01/06/19. She denies any abnormal vaginal discharge, pelvic pain or other concerns. Having irregular light bleeding. Has not had sex yet.  Past Medical History:  Diagnosis Date  . Gestational diabetes     Past Surgical History:  Procedure Laterality Date  . NO PAST SURGERIES     The following portions of the patient's history were reviewed and updated as appropriate: allergies, current medications, past family history, past medical history, past social history, past surgical history and problem list.   Review of Systems:  Pertinent items noted in HPI and remainder of comprehensive ROS otherwise negative. +VB No pelvic pain  Objective:  Physical Exam BP 123/86   Pulse 79   Wt 78.8 kg   LMP 02/02/2019   BMI 36.32 kg/m  CONSTITUTIONAL: Well-developed, well-nourished female in no acute distress.  HENT:  Normocephalic, atraumatic.  SKIN: Skin is warm and dry. No rash noted. Not diaphoretic. No erythema. No pallor. NEUROLOGIC: Alert and oriented to person, place, and time.  PSYCHIATRIC: Normal mood and affect. Normal behavior. Normal judgment and thought content. CARDIOVASCULAR: Normal heart rate noted RESPIRATORY: Effort and rate normal ABDOMEN: Soft, no distention noted.   PELVIC: Normal appearing external genitalia; normal appearing vaginal mucosa and cervix. IUD string seen, appears longer than 3cm as documented in insertion note. Trimmed string to 3cm today. No abnormal discharge noted.    Assessment & Plan:  IUD check up Abnormal length IUD strings  Pelvic US in 1 week [facility] prn Use condoms or abstain until correct IUD placement confirmed Hx of GDM, did not have pp GTT- recommend pt to schedule  Please refer to After Visit Summary for other counseling recommendations.    Julianne Handler, CNM 02/08/2019 11:56 AM

## 2019-02-09 ENCOUNTER — Other Ambulatory Visit: Payer: Medicaid Other

## 2019-02-09 ENCOUNTER — Telehealth: Payer: Self-pay

## 2019-02-09 DIAGNOSIS — Z8632 Personal history of gestational diabetes: Secondary | ICD-10-CM | POA: Diagnosis not present

## 2019-02-09 NOTE — Telephone Encounter (Signed)
  Left VM message for patient to call regarding her Paperwork.

## 2019-02-10 LAB — GLUCOSE TOLERANCE, 2 HOURS
Glucose, 2 hour: 124 mg/dL (ref 65–139)
Glucose, GTT - Fasting: 87 mg/dL (ref 65–99)

## 2019-02-15 ENCOUNTER — Other Ambulatory Visit: Payer: Self-pay

## 2019-02-15 ENCOUNTER — Ambulatory Visit (HOSPITAL_COMMUNITY)
Admission: RE | Admit: 2019-02-15 | Discharge: 2019-02-15 | Disposition: A | Discharge status: Home / Self Care | Payer: Medicaid Other | Source: Ambulatory Visit | Attending: Certified Nurse Midwife | Admitting: Certified Nurse Midwife

## 2019-02-15 DIAGNOSIS — Z30431 Encounter for routine checking of intrauterine contraceptive device: Secondary | ICD-10-CM | POA: Diagnosis not present

## 2019-02-16 ENCOUNTER — Telehealth: Payer: Self-pay

## 2019-02-16 NOTE — Telephone Encounter (Signed)
Attempted to contact about U/S results and need for appt, no answer, left vm

## 2019-02-17 ENCOUNTER — Telehealth: Payer: Self-pay

## 2019-02-17 NOTE — Telephone Encounter (Signed)
S/w pt and advised of U/S results and need for IUD removal and re-insertion, transferred to scheduler.

## 2019-02-28 ENCOUNTER — Encounter: Payer: Self-pay | Admitting: Obstetrics & Gynecology

## 2019-02-28 ENCOUNTER — Other Ambulatory Visit: Payer: Self-pay

## 2019-02-28 ENCOUNTER — Ambulatory Visit: Payer: Medicaid Other | Admitting: Obstetrics & Gynecology

## 2019-02-28 VITALS — BP 126/81 | HR 60 | Temp 98.3°F | Ht 58.5 in | Wt 173.9 lb

## 2019-02-28 DIAGNOSIS — Z30430 Encounter for insertion of intrauterine contraceptive device: Secondary | ICD-10-CM

## 2019-02-28 DIAGNOSIS — Z30432 Encounter for removal of intrauterine contraceptive device: Secondary | ICD-10-CM

## 2019-02-28 DIAGNOSIS — Z3009 Encounter for other general counseling and advice on contraception: Secondary | ICD-10-CM

## 2019-02-28 DIAGNOSIS — Z3202 Encounter for pregnancy test, result negative: Secondary | ICD-10-CM

## 2019-02-28 LAB — POCT URINE PREGNANCY: Preg Test, Ur: NEGATIVE

## 2019-02-28 MED ORDER — LEVONORGESTREL 20 MCG/24HR IU IUD
INTRAUTERINE_SYSTEM | Freq: Once | INTRAUTERINE | Status: AC
  Administered 2019-02-28: 16:00:00 via INTRAUTERINE

## 2019-02-28 NOTE — Progress Notes (Signed)
[facility] PROCEDURE NOTE  Zoe Jensen is a 28 y.o. [phone] here for Mirena IUD insertion. She has an IUD in place but, was having pain and US revealed that the IUD is not in its proper place. She is here for an IUD removal and reinsertion.  Last pap smear was in 07/2017 at the [facility] and was normal.  02/15/2019  CLINICAL DATA:  Abnormal length of IUD string, IUD check  EXAM: ULTRASOUND PELVIS TRANSVAGINAL  TECHNIQUE: Transvaginal ultrasound examination of the pelvis was performed including evaluation of the uterus, ovaries, adnexal regions, and pelvic cul-de-sac.  COMPARISON:  None  FINDINGS: Uterus  Measurements: 7.8 x 3.8 x 5.5 cm = volume: 84 mL. Anteverted. Normal morphology without mass  Endometrium  Thickness: 5 mm. No endometrial fluid. Abnormal position of the IUD, located at the lower to mid uterine segments, with at least 1 arm appearing to extend intramural.  Right ovary  Measurements: 3.2 x 1.7 x 2.1 cm = volume: 6.0 mL. Normal morphology without mass  Left ovary  Measurements: 3.9 x 1.8 x 2.1 cm = volume: 7.4 mL. Normal morphology without mass  Other findings:  No free pelvic fluid.  No adnexal masses.  IMPRESSION: IUD appears abnormally low in position located at the lower to mid uterine segments, with at least 1 arm extending intramural.  IUD Insertion Procedure Note Patient identified, informed consent performed.  Discussed risks of irregular bleeding, cramping, infection, malpositioning or misplacement of the IUD outside the uterus which may require further procedures. Time out was performed.  Urine pregnancy test negative.  Speculum placed in the vagina.  Cervix visualized.  Cleaned with Betadine x 2.  Grasped anteriorly with a single tooth tenaculum.  The strings of the IUD were grasped and pulled using ring forceps.  The IUD was successfully removed in its entirety.   Uterus sounded to 8.5 cm.  Mirena IUD placed per  manufacturer's recommendations.  Strings trimmed to 3 cm. Tenaculum was removed, good hemostasis noted.  Patient tolerated procedure well.   Patient was given post-procedure instructions.  Patient was asked to follow up in 4 weeks for IUD check.  Long Brimage L. Harraway-Smith, M.D., Cherlynn June

## 2019-02-28 NOTE — Progress Notes (Signed)
Pt is in the office for IUD removal and re-insertion. IUD was inserted on 01-06-19.

## 2019-02-28 NOTE — Patient Instructions (Signed)
Intrauterine Device Insertion, Care After  This sheet gives you information about how to care for yourself after your procedure. Your health care provider may also give you more specific instructions. If you have problems or questions, contact your health care provider. What can I expect after the procedure? After the procedure, it is common to have:  Cramps and pain in the abdomen.  Light bleeding (spotting) or heavier bleeding that is like your menstrual period. This may last for up to a few days.  Lower back pain.  Dizziness.  Headaches.  Nausea. Follow these instructions at home:  Before resuming sexual activity, check to make sure that you can feel the IUD string(s). You should be able to feel the end of the string(s) below the opening of your cervix. If your IUD string is in place, you may resume sexual activity. ? If you had a hormonal IUD inserted more than 7 days after your most recent period started, you will need to use a backup method of birth control for 7 days after IUD insertion. Ask your health care provider whether this applies to you.  Continue to check that the IUD is still in place by feeling for the string(s) after every menstrual period, or once a month.  Take over-the-counter and prescription medicines only as told by your health care provider.  Do not drive or use heavy machinery while taking prescription pain medicine.  Keep all follow-up visits as told by your health care provider. This is important. Contact a health care provider if:  You have bleeding that is heavier or lasts longer than a normal menstrual cycle.  You have a fever.  You have cramps or abdominal pain that get worse or do not get better with medicine.  You develop abdominal pain that is new or is not in the same area of earlier cramping and pain.  You feel lightheaded or weak.  You have abnormal or bad-smelling discharge from your vagina.  You have pain during sexual activity.   You have any of the following problems with your IUD string(s): ? The string bothers or hurts you or your sexual partner. ? You cannot feel the string. ? The string has gotten longer.  You can feel the IUD in your vagina.  You think you may be pregnant, or you miss your menstrual period.  You think you may have an STI (sexually transmitted infection). Get help right away if:  You have flu-like symptoms.  You have a fever and chills.  You can feel that your IUD has slipped out of place. Summary  After the procedure, it is common to have cramps and pain in the abdomen. It is also common to have light bleeding (spotting) or heavier bleeding that is like your menstrual period.  Continue to check that the IUD is still in place by feeling for the string(s) after every menstrual period, or once a month.  Keep all follow-up visits as told by your health care provider. This is important.  Contact your health care provider if you have problems with your IUD string(s), such as the string getting longer or bothering you or your sexual partner. This information is not intended to replace advice given to you by your health care provider. Make sure you discuss any questions you have with your health care provider. Document Released: 03/12/2011 Document Revised: 06/26/2017 Document Reviewed: 06/04/2016 Elsevier Patient Education  2020 Elsevier [employer]. Levonorgestrel intrauterine device (IUD) What is this medicine? LEVONORGESTREL IUD (LEE voe nor jes   trel) is a contraceptive (birth control) device. The device is placed inside the uterus by a healthcare professional. It is used to prevent pregnancy. This device can also be used to treat heavy bleeding that occurs during your period. This medicine may be used for other purposes; ask your health care provider or pharmacist if you have questions. COMMON BRAND NAME(S): Kyleena, LILETTA, Mirena, Skyla What should I tell my health care provider before I take  this medicine? They need to know if you have any of these conditions:  abnormal Pap smear  cancer of the breast, uterus, or cervix  diabetes  endometritis  genital or pelvic infection now or in the past  have more than one sexual partner or your partner has more than one partner  heart disease  history of an ectopic or tubal pregnancy  immune system problems  IUD in place  liver disease or tumor  problems with blood clots or take blood-thinners  seizures  use intravenous drugs  uterus of unusual shape  vaginal bleeding that has not been explained  an unusual or allergic reaction to levonorgestrel, other hormones, silicone, or polyethylene, medicines, foods, dyes, or preservatives  pregnant or trying to get pregnant  breast-feeding How should I use this medicine? This device is placed inside the uterus by a health care professional. Talk to your pediatrician regarding the use of this medicine in children. Special care may be needed. Overdosage: If you think you have taken too much of this medicine contact a poison control center or emergency room at once. NOTE: This medicine is only for you. Do not share this medicine with others. What if I miss a dose? This does not apply. Depending on the brand of device you have inserted, the device will need to be replaced every 3 to 6 years if you wish to continue using this type of birth control. What may interact with this medicine? Do not take this medicine with any of the following medications:  amprenavir  bosentan  fosamprenavir This medicine may also interact with the following medications:  aprepitant  armodafinil  barbiturate medicines for inducing sleep or treating seizures  bexarotene  boceprevir  griseofulvin  medicines to treat seizures like carbamazepine, ethotoin, felbamate, oxcarbazepine, phenytoin, topiramate  modafinil  pioglitazone  rifabutin  rifampin  rifapentine  some medicines  to treat HIV infection like atazanavir, efavirenz, indinavir, lopinavir, nelfinavir, tipranavir, ritonavir  St. John's wort  warfarin This list may not describe all possible interactions. Give your health care provider a list of all the medicines, herbs, non-prescription drugs, or dietary supplements you use. Also tell them if you smoke, drink alcohol, or use illegal drugs. Some items may interact with your medicine. What should I watch for while using this medicine? Visit your doctor or health care professional for regular check ups. See your doctor if you or your partner has sexual contact with others, becomes HIV positive, or gets a sexual transmitted disease. This product does not protect you against HIV infection (AIDS) or other sexually transmitted diseases. You can check the placement of the IUD yourself by reaching up to the top of your vagina with clean fingers to feel the threads. Do not pull on the threads. It is a good habit to check placement after each menstrual period. Call your doctor right away if you feel more of the IUD than just the threads or if you cannot feel the threads at all. The IUD may come out by itself. You may become pregnant if   the device comes out. If you notice that the IUD has come out use a backup birth control method like condoms and call your health care provider. Using tampons will not change the position of the IUD and are okay to use during your period. This IUD can be safely scanned with magnetic resonance imaging (MRI) only under specific conditions. Before you have an MRI, tell your healthcare provider that you have an IUD in place, and which type of IUD you have in place. What side effects may I notice from receiving this medicine? Side effects that you should report to your doctor or health care professional as soon as possible:  allergic reactions like skin rash, itching or hives, swelling of the face, lips, or tongue  fever, flu-like symptoms   genital sores  high blood pressure  no menstrual period for 6 weeks during use  pain, swelling, warmth in the leg  pelvic pain or tenderness  severe or sudden headache  signs of pregnancy  stomach cramping  sudden shortness of breath  trouble with balance, talking, or walking  unusual vaginal bleeding, discharge  yellowing of the eyes or skin Side effects that usually do not require medical attention (report to your doctor or health care professional if they continue or are bothersome):  acne  breast pain  change in sex drive or performance  changes in weight  cramping, dizziness, or faintness while the device is being inserted  headache  irregular menstrual bleeding within first 3 to 6 months of use  nausea This list may not describe all possible side effects. Call your doctor for medical advice about side effects. You may report side effects to FDA at 1-800-FDA-1088. Where should I keep my medicine? This does not apply. NOTE: This sheet is a summary. It may not cover all possible information. If you have questions about this medicine, talk to your doctor, pharmacist, or health care provider.  2020 Elsevier/Gold Standard (2018-05-25 13:22:01)  

## 2019-03-30 ENCOUNTER — Other Ambulatory Visit: Payer: Self-pay

## 2019-03-30 ENCOUNTER — Encounter: Payer: Self-pay | Admitting: Obstetrics and Gynecology

## 2019-03-30 ENCOUNTER — Ambulatory Visit: Payer: Medicaid Other | Admitting: Obstetrics and Gynecology

## 2019-03-30 DIAGNOSIS — Z30431 Encounter for routine checking of intrauterine contraceptive device: Secondary | ICD-10-CM

## 2019-03-30 DIAGNOSIS — Z975 Presence of (intrauterine) contraceptive device: Secondary | ICD-10-CM | POA: Diagnosis not present

## 2019-03-30 DIAGNOSIS — Z87898 Personal history of other specified conditions: Secondary | ICD-10-CM

## 2019-03-30 NOTE — Patient Instructions (Signed)
Health Maintenance, Female Adopting a healthy lifestyle and getting preventive care are important in promoting health and wellness. Ask your health care provider about:  The right schedule for you to have regular tests and exams.  Things you can do on your own to prevent diseases and keep yourself healthy. What should I know about diet, weight, and exercise? Eat a healthy diet   Eat a diet that includes plenty of vegetables, fruits, low-fat dairy products, and lean protein.  Do not eat a lot of foods that are high in solid fats, added sugars, or sodium. Maintain a healthy weight Body mass index (BMI) is used to identify weight problems. It estimates body fat based on height and weight. Your health care provider can help determine your BMI and help you achieve or maintain a healthy weight. Get regular exercise Get regular exercise. This is one of the most important things you can do for your health. Most adults should:  Exercise for at least 150 minutes each week. The exercise should increase your heart rate and make you sweat (moderate-intensity exercise).  Do strengthening exercises at least twice a week. This is in addition to the moderate-intensity exercise.  Spend less time sitting. Even light physical activity can be beneficial. Watch cholesterol and blood lipids Have your blood tested for lipids and cholesterol at 30 years of age, then have this test every 5 years. Have your cholesterol levels checked more often if:  Your lipid or cholesterol levels are high.  You are older than 30 years of age.  You are at high risk for heart disease. What should I know about cancer screening? Depending on your health history and family history, you may need to have cancer screening at various ages. This may include screening for:  Breast cancer.  Cervical cancer.  Colorectal cancer.  Skin cancer.  Lung cancer. What should I know about heart disease, diabetes, and high blood  pressure? Blood pressure and heart disease  High blood pressure causes heart disease and increases the risk of stroke. This is more likely to develop in people who have high blood pressure readings, are of African descent, or are overweight.  Have your blood pressure checked: ? Every 3-5 years if you are 18-39 years of age. ? Every year if you are 40 years old or older. Diabetes Have regular diabetes screenings. This checks your fasting blood sugar level. Have the screening done:  Once every three years after age 40 if you are at a normal weight and have a low risk for diabetes.  More often and at a younger age if you are overweight or have a high risk for diabetes. What should I know about preventing infection? Hepatitis B If you have a higher risk for hepatitis B, you should be screened for this virus. Talk with your health care provider to find out if you are at risk for hepatitis B infection. Hepatitis C Testing is recommended for:  Everyone born from 1945 through 1965.  Anyone with known risk factors for hepatitis C. Sexually transmitted infections (STIs)  Get screened for STIs, including gonorrhea and chlamydia, if: ? You are sexually active and are younger than 30 years of age. ? You are older than 30 years of age and your health care provider tells you that you are at risk for this type of infection. ? Your sexual activity has changed since you were last screened, and you are at increased risk for chlamydia or gonorrhea. Ask your health care provider if   you are at risk.  Ask your health care provider about whether you are at high risk for HIV. Your health care provider may recommend a prescription medicine to help prevent HIV infection. If you choose to take medicine to prevent HIV, you should first get tested for HIV. You should then be tested every 3 months for as long as you are taking the medicine. Pregnancy  If you are about to stop having your period (premenopausal) and  you may become pregnant, seek counseling before you get pregnant.  Take 400 to 800 micrograms (mcg) of folic acid every day if you become pregnant.  Ask for birth control (contraception) if you want to prevent pregnancy. Osteoporosis and menopause Osteoporosis is a disease in which the bones lose minerals and strength with aging. This can result in bone fractures. If you are 65 years old or older, or if you are at risk for osteoporosis and fractures, ask your health care provider if you should:  Be screened for bone loss.  Take a calcium or vitamin D supplement to lower your risk of fractures.  Be given hormone replacement therapy (HRT) to treat symptoms of menopause. Follow these instructions at home: Lifestyle  Do not use any products that contain nicotine or tobacco, such as cigarettes, e-cigarettes, and chewing tobacco. If you need help quitting, ask your health care provider.  Do not use street drugs.  Do not share needles.  Ask your health care provider for help if you need support or information about quitting drugs. Alcohol use  Do not drink alcohol if: ? Your health care provider tells you not to drink. ? You are pregnant, may be pregnant, or are planning to become pregnant.  If you drink alcohol: ? Limit how much you use to 0-1 drink a day. ? Limit intake if you are breastfeeding.  Be aware of how much alcohol is in your drink. In the [place] one drink equals one 12 oz bottle of beer (355 mL), one 5 oz glass of wine (148 mL), or one 1 oz glass of hard liquor (44 mL). General instructions  Schedule regular health, dental, and eye exams.  Stay current with your vaccines.  Tell your health care provider if: ? You often feel depressed. ? You have ever been abused or do not feel safe at home. Summary  Adopting a healthy lifestyle and getting preventive care are important in promoting health and wellness.  Follow your health care provider's instructions about healthy  diet, exercising, and getting tested or screened for diseases.  Follow your health care provider's instructions on monitoring your cholesterol and blood pressure. This information is not intended to replace advice given to you by your health care provider. Make sure you discuss any questions you have with your health care provider. Document Released: 01/27/2011 Document Revised: 07/07/2018 Document Reviewed: 07/07/2018 Elsevier Patient Education  2020 Elsevier Inc.  

## 2019-03-30 NOTE — Progress Notes (Signed)
Ms Zoe Jensen is here for IUD string check IUD placed 02/28/19 She is doing well, some spotting at times  PE AF VSS Lungs clear Heart RRR Abd soft + BS GU nl EGBUS cervix without lesions, IUD strings noted  A/P IUD check  F/U PRN

## 2019-03-30 NOTE — Progress Notes (Signed)
Pt had Mirena placed on 02/28/19, she is here for a string check. Pt has no concerns at this time.

## 2019-06-21 ENCOUNTER — Other Ambulatory Visit: Payer: Self-pay

## 2019-06-21 DIAGNOSIS — Z20822 Contact with and (suspected) exposure to covid-19: Secondary | ICD-10-CM

## 2019-06-21 DIAGNOSIS — Z20828 Contact with and (suspected) exposure to other viral communicable diseases: Secondary | ICD-10-CM | POA: Diagnosis not present

## 2019-06-22 LAB — NOVEL CORONAVIRUS, NAA: SARS-CoV-2, NAA: NOT DETECTED

## 2020-08-20 IMAGING — US US MFM OB COMP +14 WKS
1 series · 13 of 28 positions shown · non-contrast
Comparison: none

[Series 1: us mfm ob comp +14 wks · 13 of 55 slices shown]
[im 3/55]
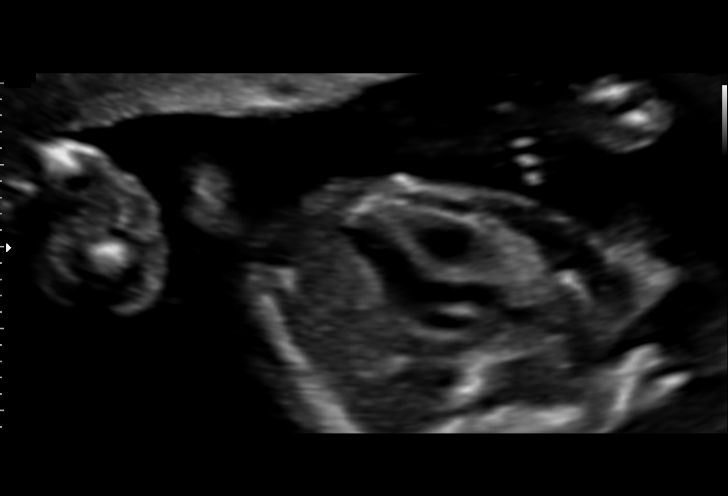
[im 7/55]
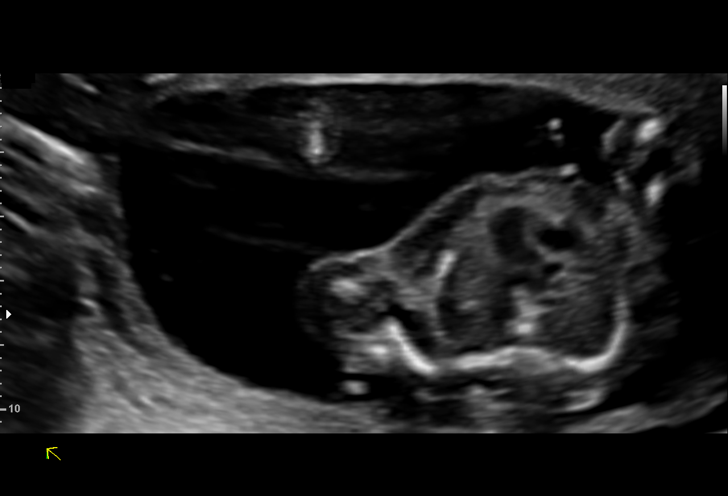
[im 11/55]
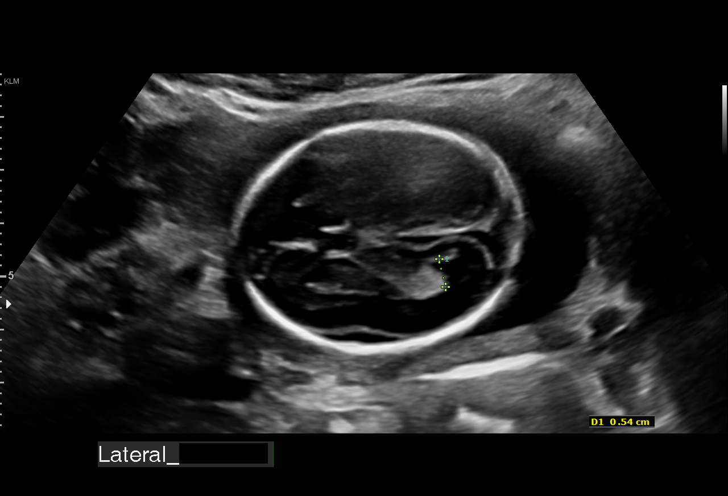
[im 15/55]
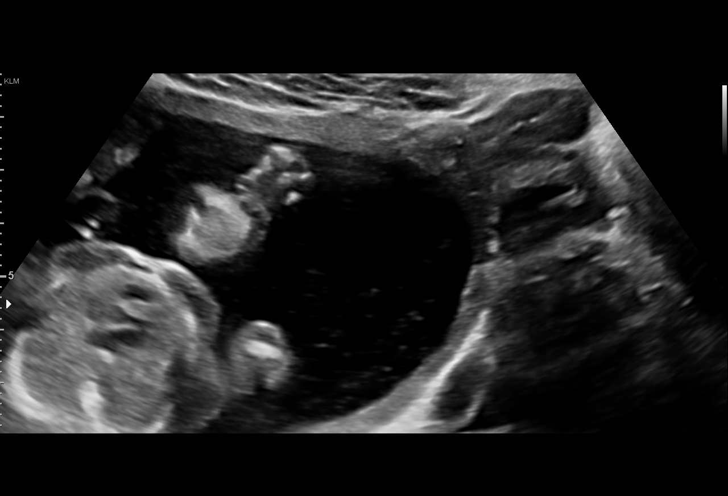
[im 19/55]
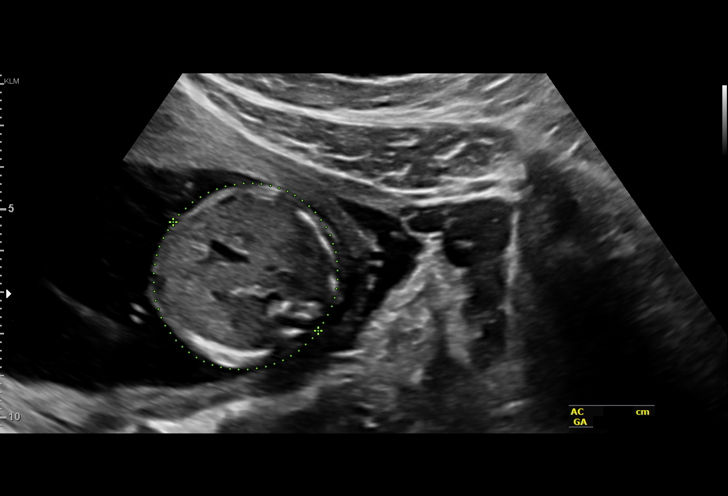
[im 23/55]
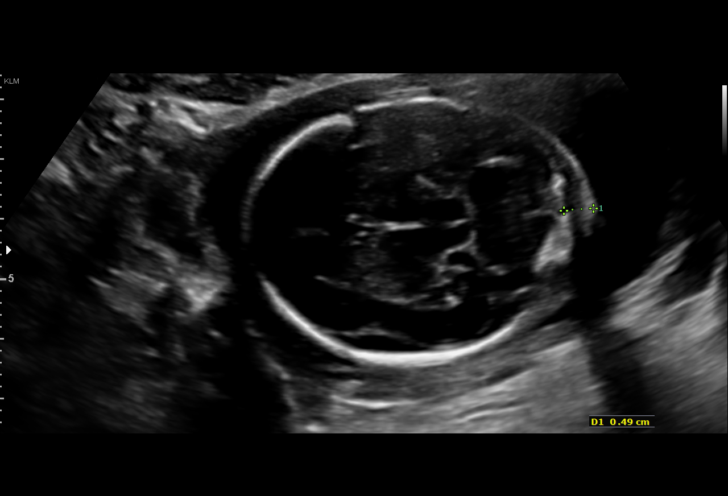
[im 29/55]
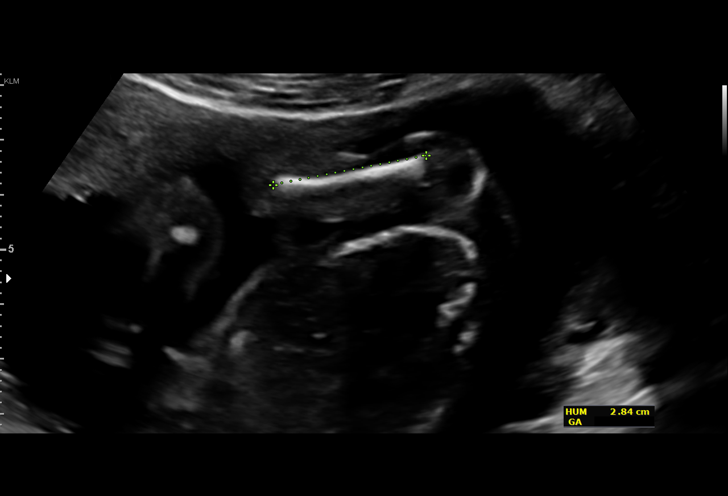
[im 33/55]
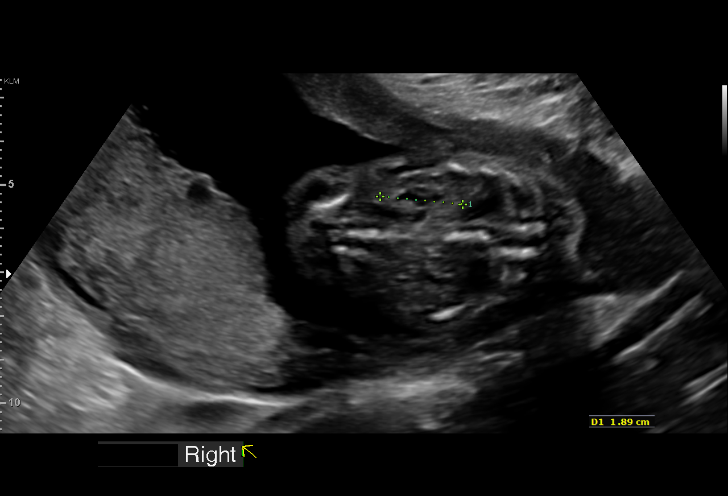
[im 37/55]
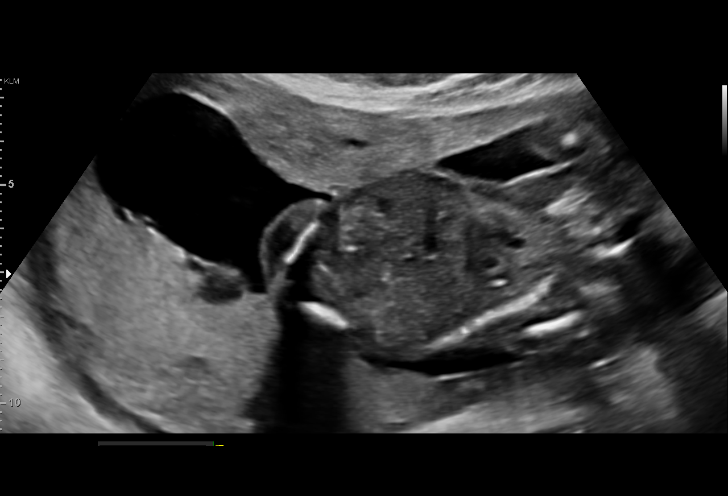
[im 41/55]
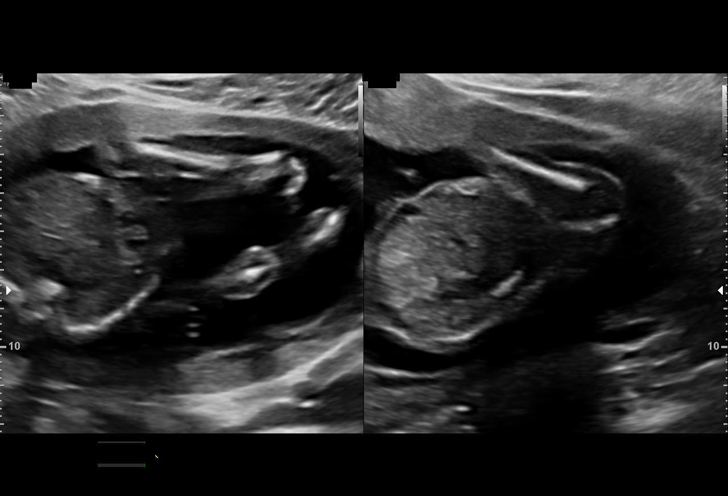
[im 45/55]
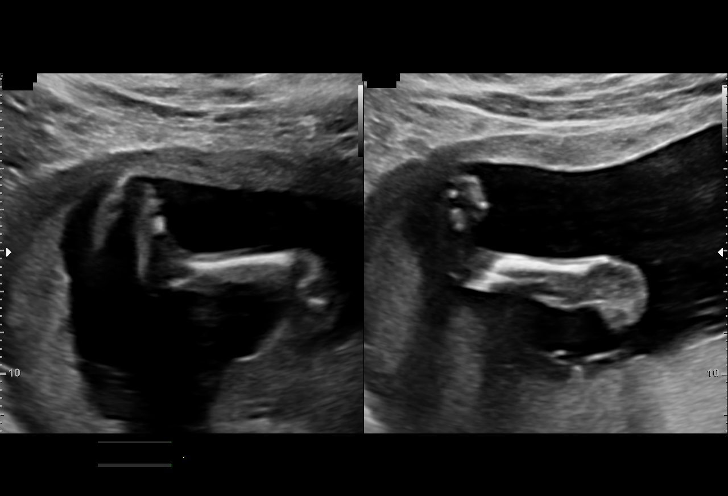
[im 49/55]
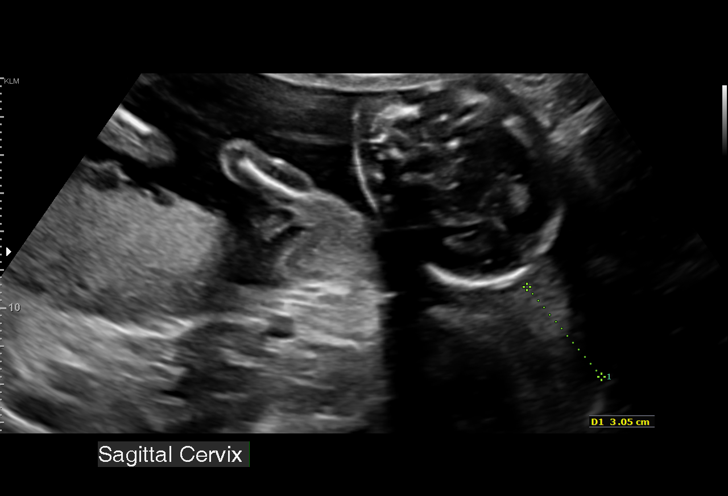
[im 53/55]
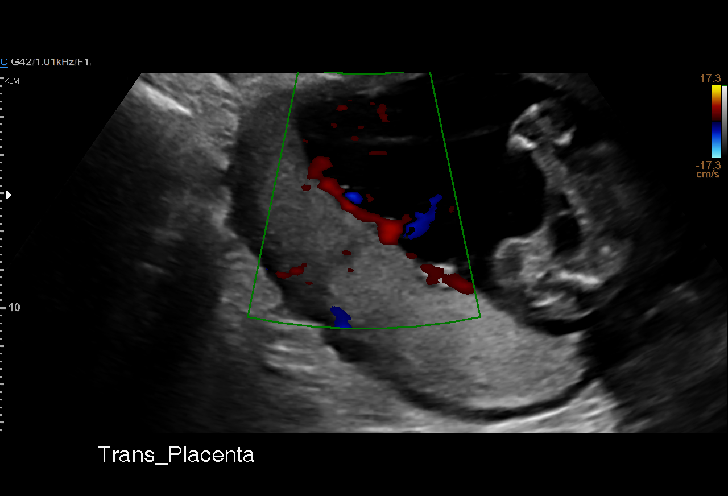

[13 of 28 positions shown; findings below may reference images not displayed]

----------------------------------------------------------------------

 ----------------------------------------------------------------------
Indications

  Encounter for antenatal screening for
  malformations (normal NIPS)
  19 weeks gestation of pregnancy
 ----------------------------------------------------------------------
Fetal Evaluation

 Num Of Fetuses:          1
 Fetal Heart              155
 Rate(bpm):
 Cardiac Activity:        Observed
 Presentation:            Cephalic
 Placenta:                Posterior
 P. Cord Insertion:       Visualized

 Amniotic Fluid
 AFI FV:      Within normal limits
Biometry

 BPD:      42.1  mm     G. Age:  18w 5d         11  %    CI:         71.38  %    70 - 86
                                                         FL/HC:       18.3  %    16.8 -
 HC:      158.7  mm     G. Age:  18w 5d          6  %    HC/AC:       1.10       1.09 -
 AC:      144.2  mm     G. Age:  19w 5d         41  %    FL/BPD:      68.9  %
 FL:         29  mm     G. Age:  18w 6d         14  %    FL/AC:       20.1  %    20 - 24
 HUM:      27.1  mm     G. Age:  18w 4d         21  %

 Est. FW:     284   g    0 lb 10 oz      37  %
                    m
OB History
 Gravidity:    3         Term:   2        Prem:   0         SAB:   0
 TOP:          0       Ectopic:  0        Living: 2
Gestational Age

 LMP:           19w 6d        Date:  03/10/18                 EDD:    12/15/18
 U/S Today:     19w 0d                                        EDD:    12/21/18
 Best:          19w 6d     Det. By:  LMP  (03/10/18)          EDD:    12/15/18
Anatomy

 Cranium:               Appears normal         Aortic Arch:            Appears normal
 Cavum:                 Appears normal         Ductal Arch:            Appears normal
 Ventricles:            Appears normal         Diaphragm:              Appears normal
 Choroid Plexus:        Appears normal         Stomach:                Appears normal,
                                                                       left sided
 Cerebellum:            Appears normal         Abdomen:                Appears normal
 Posterior Fossa:       Appears normal         Abdominal Wall:         Appears nml (cord
                                                                       insert, abd wall)
 Nuchal Fold:           Appears normal         Cord Vessels:           Appears normal (3
                                                                       vessel cord)
 Face:                  Appears normal         Kidneys:                Appear normal
                        (orbits and profile)
 Lips:                  Appears normal         Bladder:                Appears normal
 Thoracic:              Appears normal         Spine:                  Not well visualized
 Heart:                 Appears normal         Upper Extremities:      Appears normal
                        (4CH, axis, and
                        situs
 RVOT:                  Appears normal         Lower Extremities:      Appears normal
 LVOT:                  Appears normal

 Other:  Fetus appears to be a male. Heels visualized. Hands not well
         visualized. Technically difficult due to fetal position.
Cervix Uterus Adnexa

 Cervix
 Length:              3  cm.
 Normal appearance by transabdominal scan.

 Adnexa
 No abnormality visualized.
Impression

 Normal interval growth.  No ultrasonic evidence of structural
 fetal anomalies.
 Suboptimal views of the fetal spine and hands were obtained
 secondary to fetal position.
Recommendations

 Follow up growth in 4 weeks to complete the fetal anatomy.

## 2020-12-31 IMAGING — US US MFM OB FOLLOW UP
1 series · 14 of 28 positions shown · non-contrast
Comparison: none

[Series 1: us mfm ob follow up · 43 acquisitions, 14 frames shown]
[im 2/43]
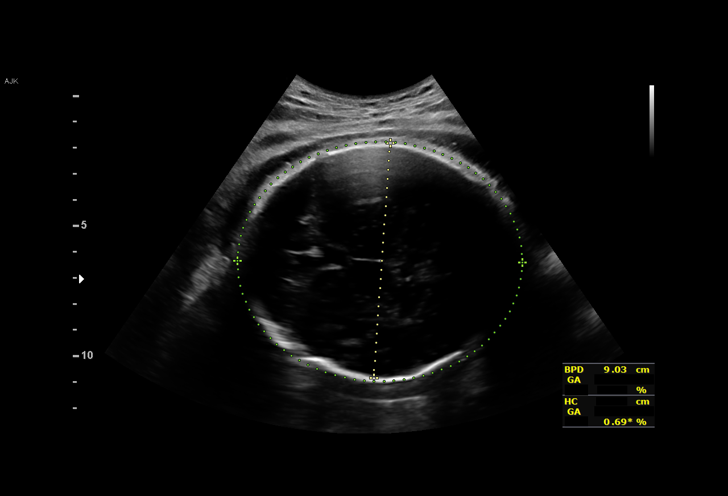
[im 5/43]
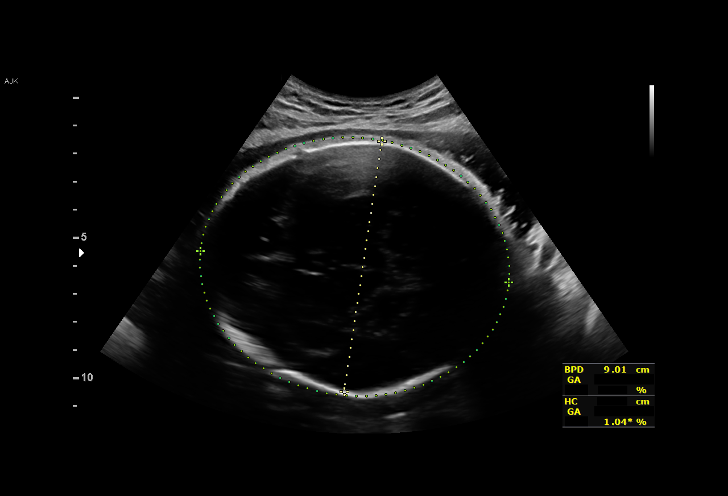
[im 8/43]
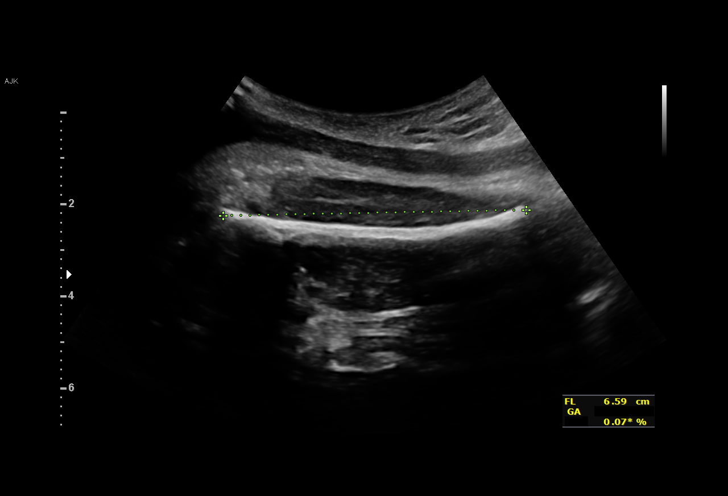
[im 11/43]
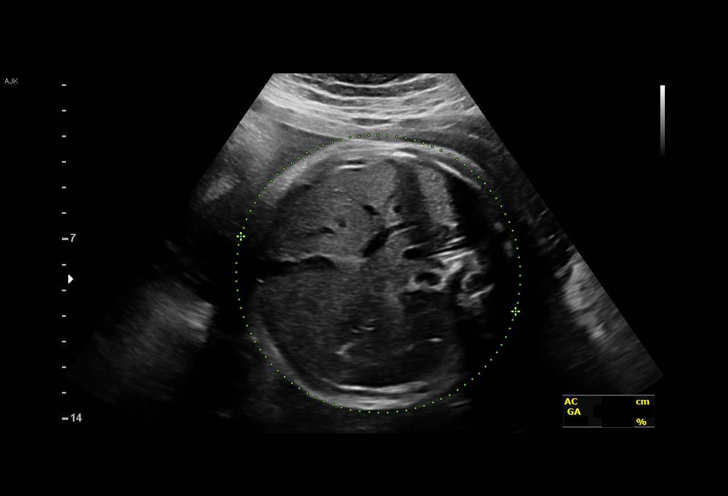
[im 15/43]
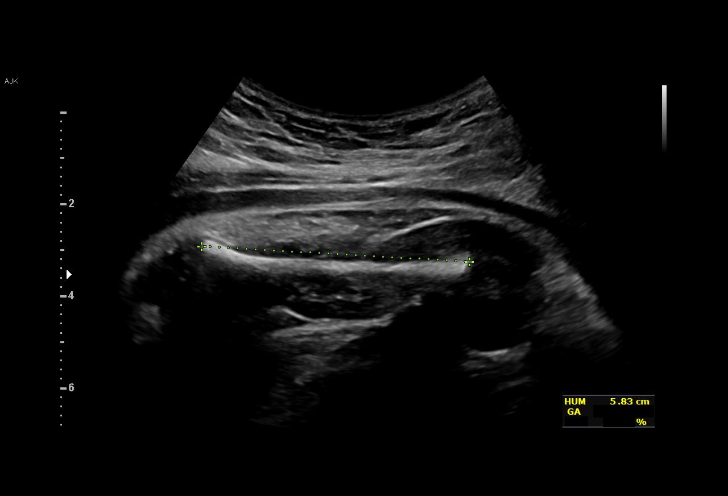
[im 18/43]
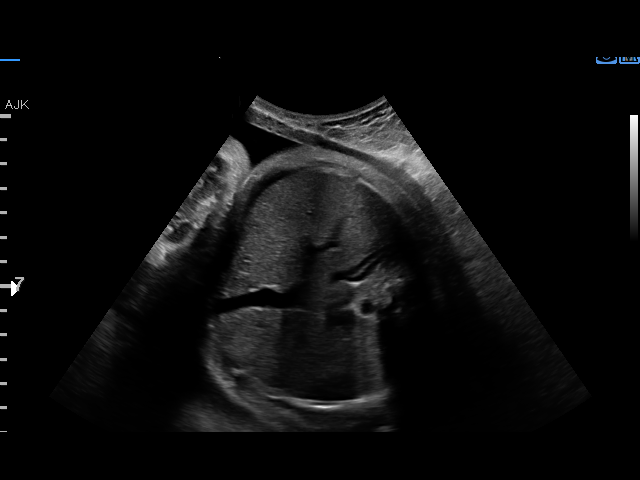
[im 21/43]
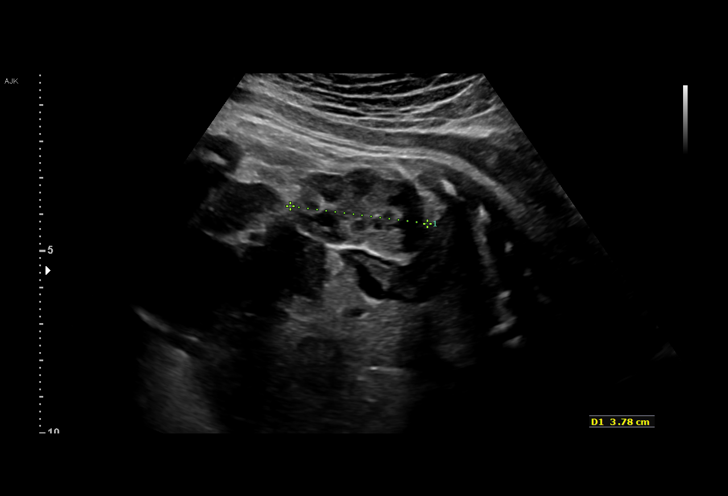
[im 24/43]
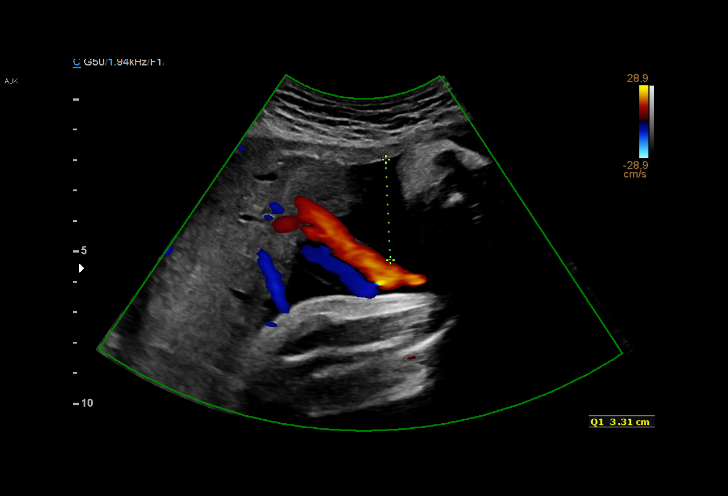
[im 27/43]
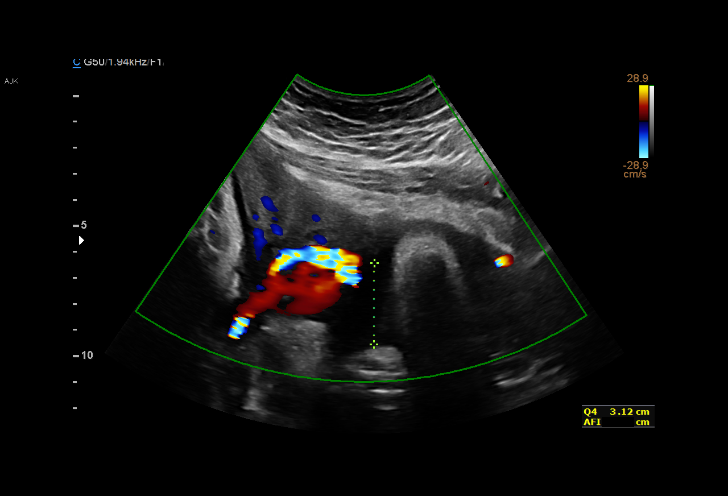
[im 30/43]
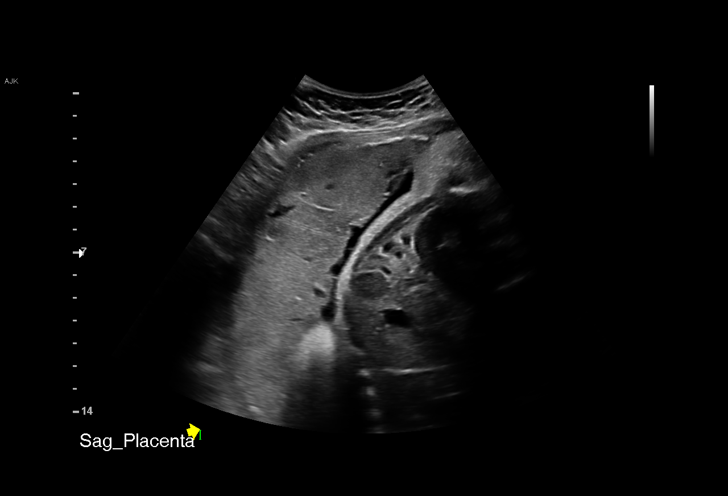
[im 33/43]
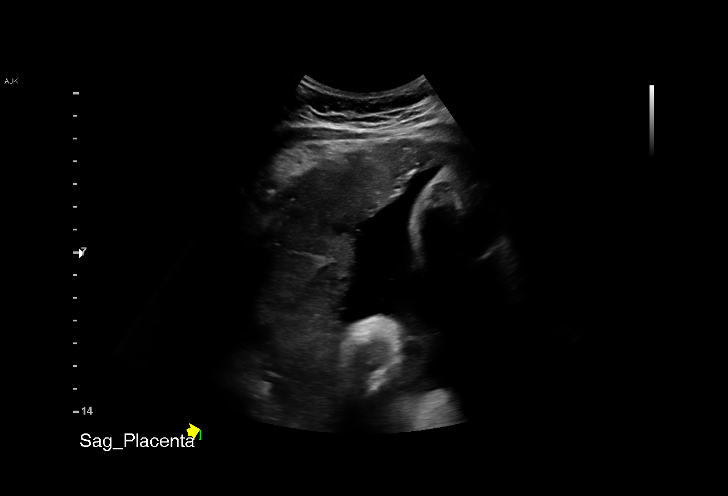
[im 36/43]
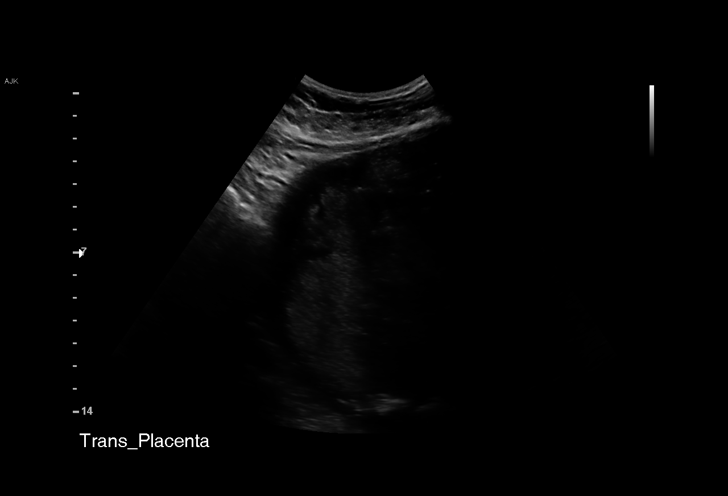
[im 39/43]
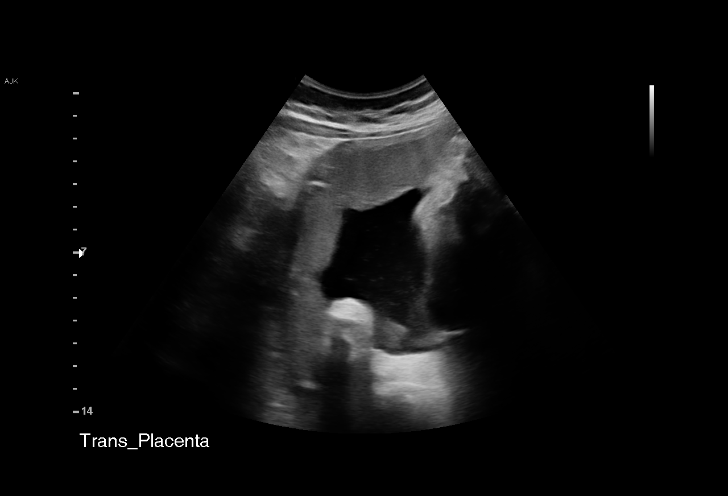
[im 43/43]
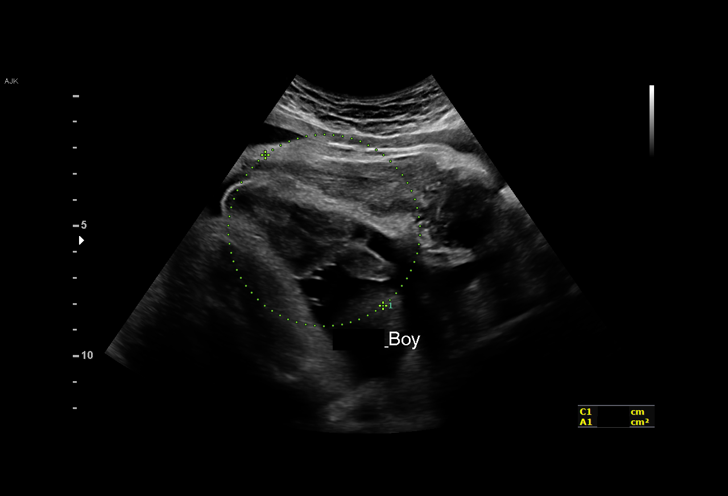

[14 of 28 positions shown; findings below may reference images not displayed]

----------------------------------------------------------------------

 ----------------------------------------------------------------------
Indications

  38 weeks gestation of pregnancy
  Gestational diabetes in pregnancy, diet
  controlled
  Obesity complicating pregnancy, third
  trimester (37 BMI) ( Low Risk NIPS)
 ----------------------------------------------------------------------
Vital Signs

                                                Height:        4'10"
Fetal Evaluation

 Num Of Fetuses:          1
 Fetal Heart Rate(bpm):   145
 Cardiac Activity:        Observed
 Presentation:            Cephalic
 Placenta:                Posterior
 P. Cord Insertion:       Visualized

 Amniotic Fluid
 AFI FV:      Within normal limits

 AFI Sum(cm)     %Tile       Largest Pocket(cm)
 11.56           40

 RUQ(cm)       RLQ(cm)       LUQ(cm)        LLQ(cm)

Biometry

 BPD:      90.1  mm     G. Age:  36w 4d         19  %    CI:        79.76   %    70 - 86
                                                         FL/HC:       20.7  %    20.6 -
 HC:      318.8  mm     G. Age:  35w 6d        < 3  %    HC/AC:       0.91       0.87 -
 AC:      348.7  mm     G. Age:  38w 5d         69  %    FL/BPD:      73.1  %    71 - 87
 FL:       65.9  mm     G. Age:  34w 0d        < 3  %    FL/AC:       18.9  %    20 - 24
 HUM:      59.5  mm     G. Age:  34w 4d        < 5  %

 Est. FW:    3776   gm   6 lb 14 oz      47  %
OB History

 Gravidity:    3         Term:   2        Prem:   0        SAB:   0
 TOP:          0       Ectopic:  0        Living: 2
Gestational Age

 LMP:           38w 6d        Date:  03/10/18                 EDD:   12/15/18
 U/S Today:     36w 2d                                        EDD:   01/02/19
 Best:          38w 6d     Det. By:  LMP  (03/10/18)          EDD:   12/15/18
Anatomy

 Cranium:               Appears normal         Aortic Arch:            Previously seen
 Cavum:                 Appears normal         Ductal Arch:            Previously seen
 Ventricles:            Previously seen        Diaphragm:              Appears normal
 Choroid Plexus:        Previously seen        Stomach:                Appears normal, left
                                                                       sided
 Cerebellum:            Previously seen        Abdomen:                Appears normal
 Posterior Fossa:       Previously seen        Abdominal Wall:         Previously seen
 Nuchal Fold:           Previously seen        Cord Vessels:           Previously seen
 Face:                  Orbits and profile     Kidneys:                Appear normal
                        previously seen
 Lips:                  Previously seen        Bladder:                Appears normal
 Thoracic:              Appears normal         Spine:                  Previously seen
 Heart:                 Previously seen        Upper Extremities:      Previously seen
 RVOT:                  Previously seen        Lower Extremities:      Previously seen
 LVOT:                  Previously seen

 Other:  Fetus appears to be a male. Heels, 5th digit previously visualized.
         Technically difficult due to fetal position. Open hands prev. visualized.
Impression

 Normal interval growth.
Recommendations

 Follow up as clinically indicated.

## 2021-03-11 IMAGING — US TRANSVAGINAL ULTRASOUND OF PELVIS
1 series · 15 of 25 positions shown · non-contrast
Comparison: None

CLINICAL DATA: Abnormal length of IUD string, IUD check

EXAM:
ULTRASOUND PELVIS TRANSVAGINAL
TECHNIQUE: Transvaginal ultrasound examination of the pelvis was performed
including evaluation of the uterus, ovaries, adnexal regions, and
pelvic cul-de-sac.

[Series 1: transvaginal ultrasound of pelvis · 51 acquisitions, 15 frames shown]
[im 1/51]
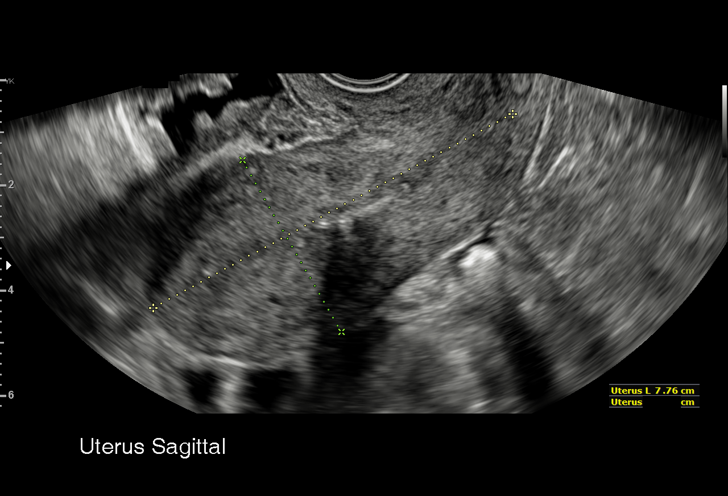
[im 5/51]
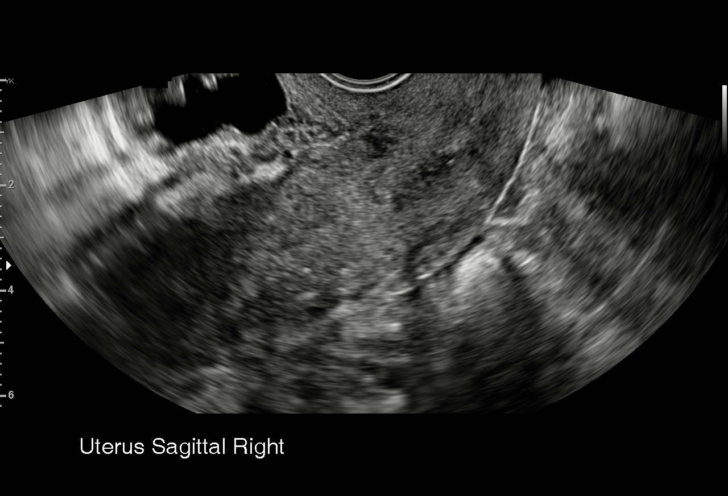
[im 9/51]
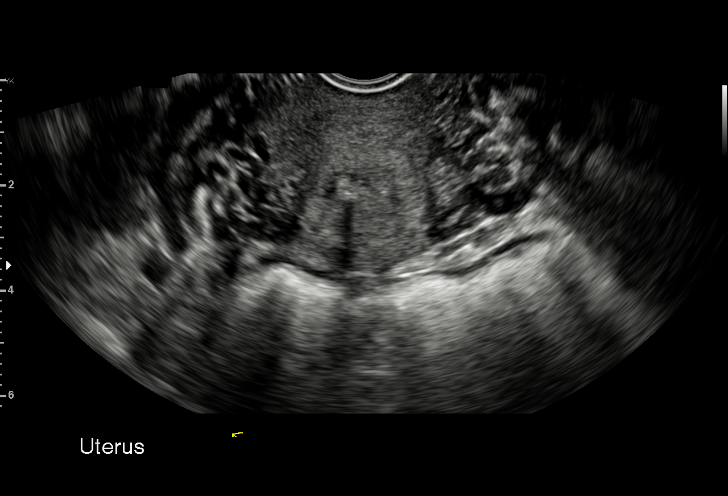
[im 11/51]
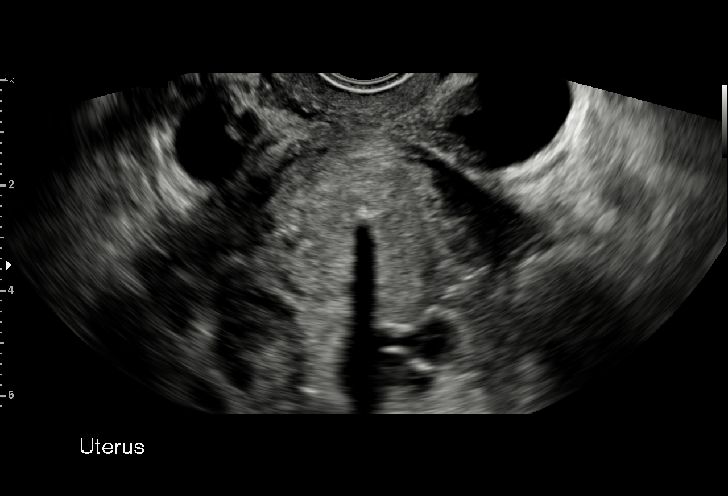
[im 15/51]
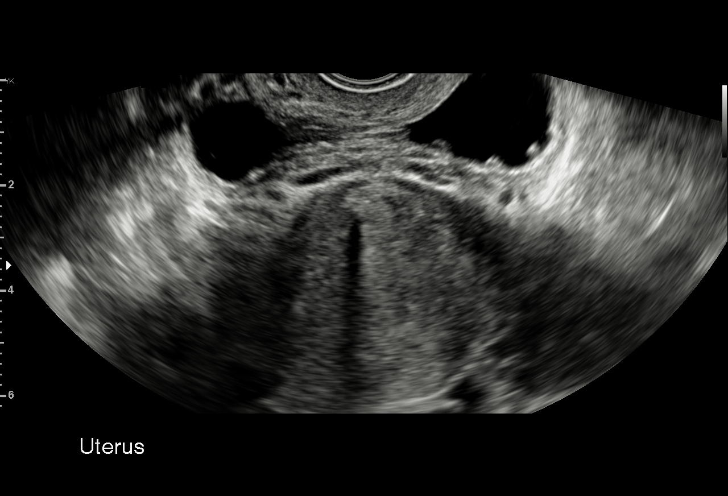
[im 19/51]
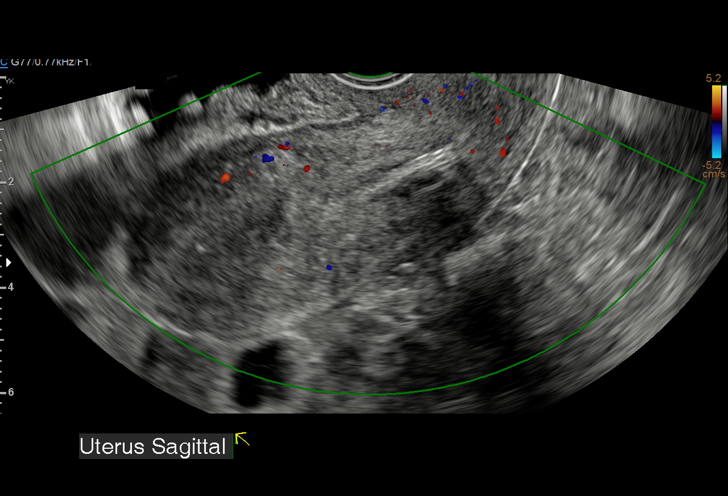
[im 21/51]
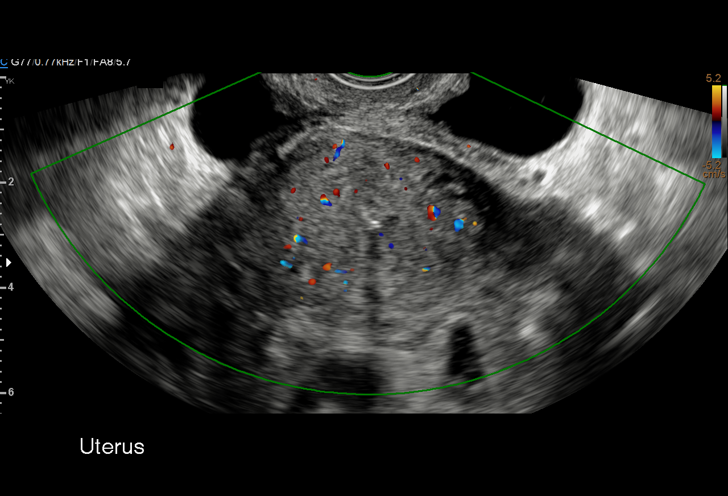
[im 26/51]
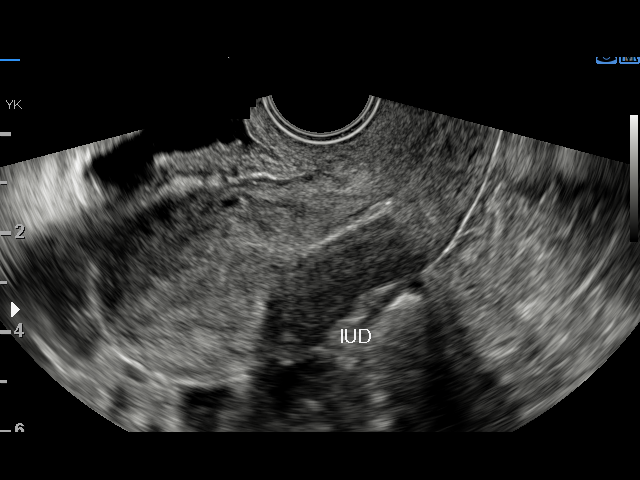
[im 30/51]
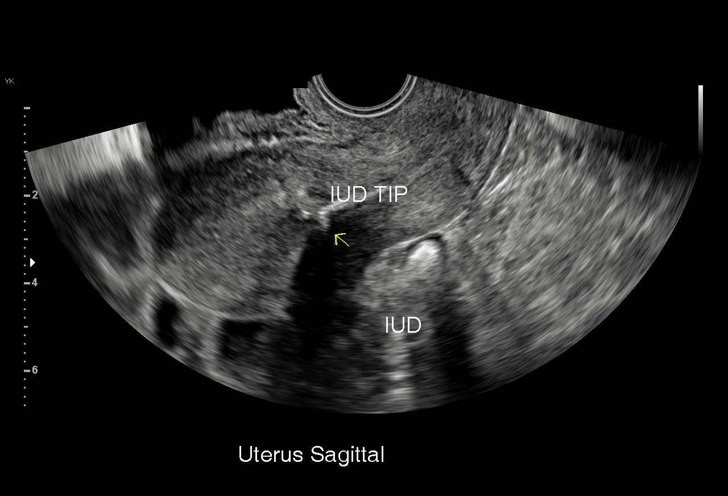
[im 32/51]
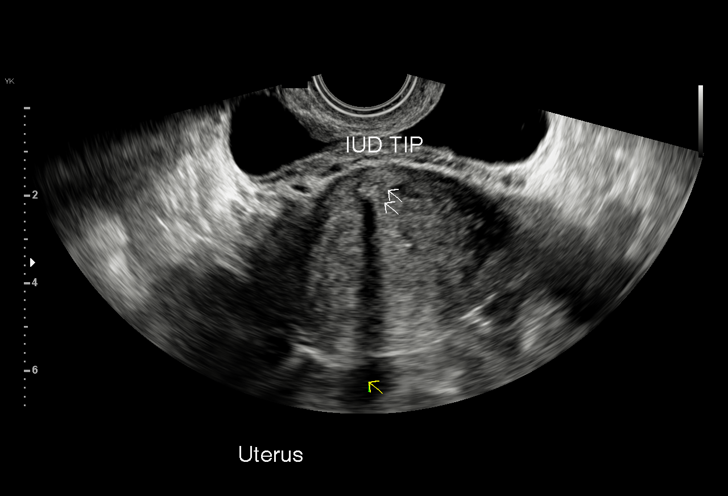
[im 36/51]
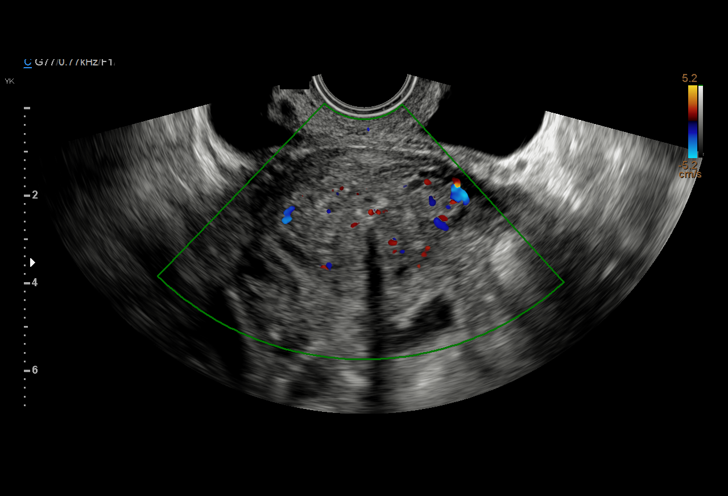
[im 40/51]
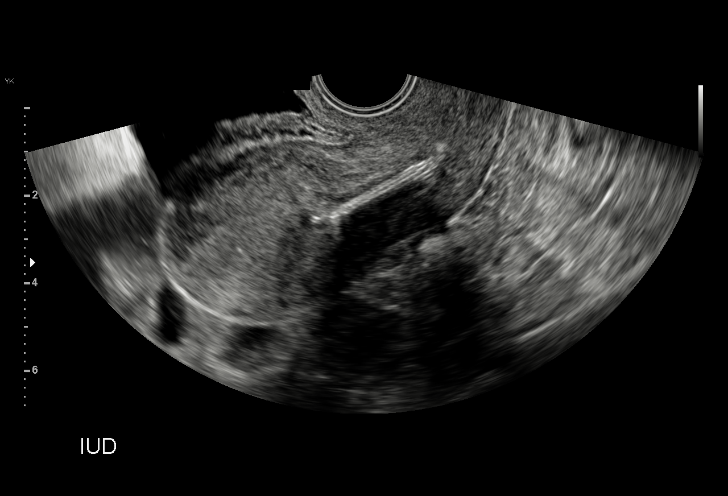
[im 42/51]
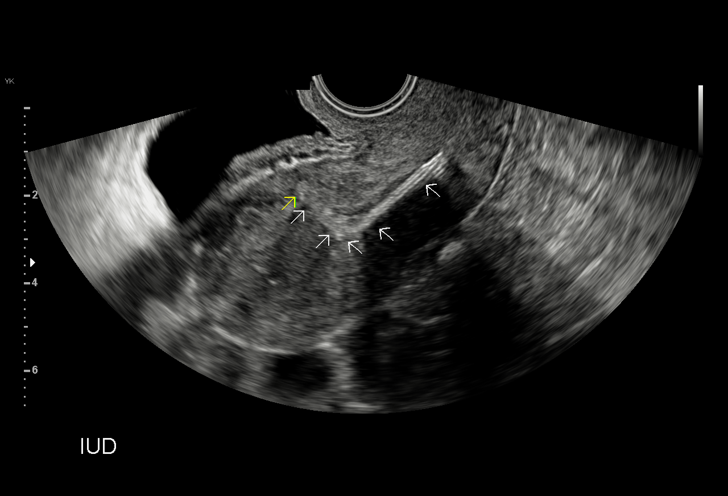
[im 46/51]
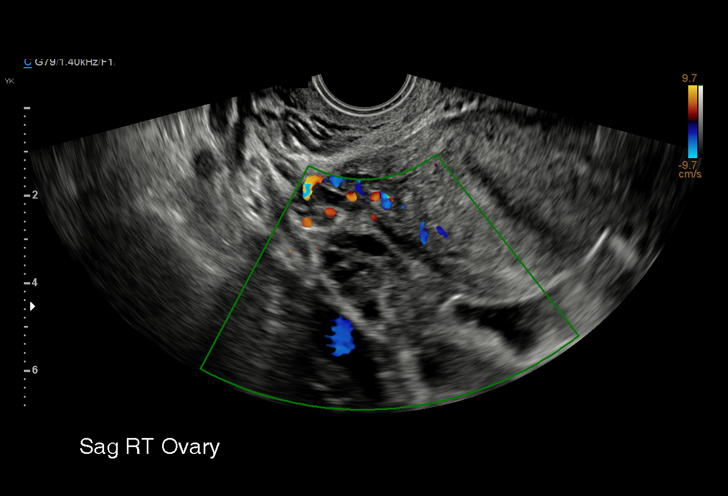
[im 51/51]
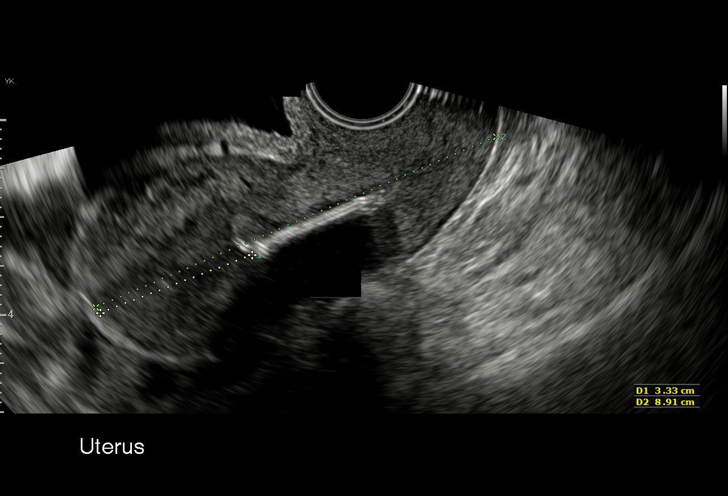

[15 of 25 positions shown; findings below may reference images not displayed]

FINDINGS: Uterus

Measurements: 7.8 x 3.8 x 5.5 cm = volume: 84 mL. Anteverted. Normal
morphology without mass

Endometrium

Thickness: 5 mm. No endometrial fluid. Abnormal position of the IUD,
located at the lower to mid uterine segments, with at least 1 arm
appearing to extend intramural.

Right ovary

Measurements: 3.2 x 1.7 x 2.1 cm = volume: 6.0 mL. Normal morphology
without mass

Left ovary

Measurements: 3.9 x 1.8 x 2.1 cm = volume: 7.4 mL. Normal morphology
without mass

Other findings:  No free pelvic fluid.  No adnexal masses.
IMPRESSION: IUD appears abnormally low in position located at the lower to mid
uterine segments, with at least 1 arm extending intramural.

## 2022-10-06 NOTE — Progress Notes (Signed)
Subjective:    Zoe Jensen - 32 y.o. female MRN [id number]  Date of birth: 16-Feb-1991  HPI  [name] is to establish care.   Current issues and/or concerns: Skin tag of right upper chest and skin lesion of right upper back present for several months and increasing in size. She denies associated red flag symptoms except for occasional itch of back. States she would like to have both removed. She plans to return at later date for annual physical exam. No further issues/concerns for discussion today.   ROS per HPI    Health Maintenance:  Health Maintenance Due  Topic Date Due   HEMOGLOBIN A1C  Never done   COVID-19 Vaccine (1) Never done   FOOT EXAM  Never done   OPHTHALMOLOGY EXAM  Never done   Diabetic kidney evaluation - eGFR measurement  Never done   Diabetic kidney evaluation - Urine ACR  Never done   Hepatitis C Screening  Never done   PAP SMEAR-Modifier  Never done    Past Medical History: Patient Active Problem List   Diagnosis Date Noted   History of impaired glucose tolerance 03/30/2019   IUD check up 03/30/2019   IUD (intrauterine device) in place 03/30/2019     Social History   reports that she has quit smoking. Her smoking use included cigarettes. She has been exposed to tobacco smoke. She has never used smokeless tobacco. She reports that she does not currently use alcohol. She reports current drug use. Drug: Marijuana.   Family History  family history includes Diabetes in her mother; Hypertension in her father and mother; Stroke in her mother.   Medications: reviewed and updated   Objective:   Physical Exam BP 116/78 (BP Location: Left Arm, Patient Position: Sitting, Cuff Size: Large)   Pulse 70   Temp 98.3 F (36.8 C)   Resp 16   Ht 4\' 10"  (1.473 m)   Wt 152 lb 9.6 oz (69.2 kg)   SpO2 96%   BMI 31.89 kg/m   Physical Exam HENT:     Head: Normocephalic and atraumatic.  Eyes:     Extraocular Movements: Extraocular movements intact.      Conjunctiva/sclera: Conjunctivae normal.     Pupils: Pupils are equal, round, and reactive to light.  Cardiovascular:     Rate and Rhythm: Normal rate and regular rhythm.     Pulses: Normal pulses.     Heart sounds: Normal heart sounds.  Pulmonary:     Effort: Pulmonary effort is normal.     Breath sounds: Normal breath sounds.  Chest:     Comments: Hyperpigmented skin tag right upper chest. No additional presentation.   Musculoskeletal:     Cervical back: Normal range of motion and neck supple.  Skin:    General: Skin is warm and dry.     Comments: Hyperpigmented skin lesion right upper back. No additional presentation.   Neurological:     General: No focal deficit present.     Mental Status: She is alert and oriented to person, place, and time.  Psychiatric:        Mood and Affect: Mood normal.        Behavior: Behavior normal.      Assessment & Plan:  1. Encounter to establish care - Patient presents today to establish care. During the interim follow-up with primary provider as scheduled.  - Return for annual physical examination, labs, and health maintenance. Arrive fasting meaning having no food for at least 8  hours prior to appointment. You may have only water or black coffee. Please take scheduled medications as normal.  2. Skin tag 3. Skin lesion of back - Referral to Dermatology for further evaluation/management.  - Ambulatory referral to Dermatology    Patient was given clear instructions to go to Emergency Department or return to medical center if symptoms don't improve, worsen, or new problems develop.The patient verbalized understanding.  I discussed the assessment and treatment plan with the patient. The patient was provided an opportunity to ask questions and all were answered. The patient agreed with the plan and demonstrated an understanding of the instructions.   The patient was advised to call back or seek an in-person evaluation if the symptoms worsen or  if the condition fails to improve as anticipated.    Ricky Stabs, NP 10/08/2022, 1:35 PM Primary Care at [facility]

## 2022-10-08 ENCOUNTER — Ambulatory Visit (INDEPENDENT_AMBULATORY_CARE_PROVIDER_SITE_OTHER): Payer: 59 | Admitting: Family

## 2022-10-08 ENCOUNTER — Encounter: Payer: Self-pay | Admitting: Family

## 2022-10-08 VITALS — BP 116/78 | HR 70 | Temp 98.3°F | Resp 16 | Ht <= 58 in | Wt 152.6 lb

## 2022-10-08 DIAGNOSIS — Z7689 Persons encountering health services in other specified circumstances: Secondary | ICD-10-CM

## 2022-10-08 DIAGNOSIS — L989 Disorder of the skin and subcutaneous tissue, unspecified: Secondary | ICD-10-CM

## 2022-10-08 DIAGNOSIS — L918 Other hypertrophic disorders of the skin: Secondary | ICD-10-CM

## 2022-10-08 NOTE — Patient Instructions (Signed)
Thank you for choosing Primary Care at [facility] for your medical home!    Zoe Jensen was seen by Camillia Herter, NP today.   Zoe Jensen's primary care provider is Camillia Herter, NP.   For the best care possible,  you should try to see Durene Fruits, NP whenever you come to office.   We look forward to seeing you again soon!  If you have any questions about your visit today,  please call us at [phone]  Or feel free to reach your provider via [place].    Keeping you healthy   Get these tests Blood pressure- Have your blood pressure checked once a year by your healthcare provider.  Normal blood pressure is 120/80. Weight- Have your body mass index (BMI) calculated to screen for obesity.  BMI is a measure of body fat based on height and weight. You can also calculate your own BMI at [employer].it. Cholesterol- Have your cholesterol checked regularly starting at age 67, sooner may be necessary if you have diabetes, high blood pressure, if a family member developed heart diseases at an early age or if you smoke.  Chlamydia, HIV, and other sexual transmitted disease- Get screened each year until the age of 33 then within three months of each new sexual partner. Diabetes- Have your blood sugar checked regularly if you have high blood pressure, high cholesterol, a family history of diabetes or if you are overweight.   Get these vaccines Flu shot- Every fall. Tetanus shot- Every 10 years. Menactra- Single dose; prevents meningitis.   Take these steps Don't smoke- If you do smoke, ask your healthcare provider about quitting. For tips on how to quit, go to www.smokefree.gov or call 1-800-QUIT-NOW. Be physically active- Exercise 5 days a week for at least 30 minutes.  If you are not already physically active start slow and gradually work up to 30 minutes of moderate physical activity.  Examples of moderate activity include walking briskly, mowing the yard, dancing,  swimming bicycling, etc. Eat a healthy diet- Eat a variety of healthy foods such as fruits, vegetables, low fat milk, low fat cheese, yogurt, lean meats, poultry, fish, beans, tofu, etc.  For more information on healthy eating, go to www.thenutritionsource.org Drink alcohol in moderation- Limit alcohol intake two drinks or less a day.  Never drink and drive. Dentist- Brush and floss teeth twice daily; visit your dentis twice a year. Depression-Your emotional health is as important as your physical health.  If you're feeling down, losing interest in things you normally enjoy please talk with your healthcare provider. Gun Safety- If you keep a gun in your home, keep it unloaded and with the safety lock on.  Bullets should be stored separately. Helmet use- Always wear a helmet when riding a motorcycle, bicycle, rollerblading or skateboarding. Safe sex- If you may be exposed to a sexually transmitted infection, use a condom Seat belts- Seat bels can save your life; always wear one. Smoke/Carbon Monoxide detectors- These detectors need to be installed on the appropriate level of your home.  Replace batteries at least once a year. Skin Cancer- When out in the sun, cover up and use sunscreen SPF 15 or higher. Violence- If anyone is threatening or hurting you, please tell your healthcare provider.

## 2022-10-08 NOTE — Progress Notes (Signed)
.  Pt presents to establish care,  -skin tag on right side of chest started small has now grown in size  -also has spot on left side of back

## 2022-10-16 ENCOUNTER — Ambulatory Visit: Payer: Medicaid Other | Admitting: Obstetrics and Gynecology

## 2022-11-04 NOTE — Progress Notes (Signed)
Patient ID: Zoe Jensen, female    DOB: [birth year]  MRN: [id number]  CC: Annual Physical Exam   Subjective: Zoe Jensen is a 32 y.o. female who presents for annual physical exam.   Her concerns today include:  - Requests iron to be checked.    Patient Active Problem List   Diagnosis Date Noted   History of impaired glucose tolerance 03/30/2019   IUD check up 03/30/2019   IUD (intrauterine device) in place 03/30/2019     No current outpatient medications on file prior to visit.   No current facility-administered medications on file prior to visit.    No Known Allergies  Social History   Socioeconomic History   Marital status: Married    Spouse name: Not on file   Number of children: Not on file   Years of education: Not on file   Highest education level: Not on file  Occupational History   Not on file  Tobacco Use   Smoking status: Former    Types: Cigarettes    Passive exposure: Past   Smokeless tobacco: Never  Vaping Use   Vaping Use: Never used  Substance and Sexual Activity   Alcohol use: Not Currently   Drug use: Yes    Types: Marijuana   Sexual activity: Not Currently    Partners: Male    Birth control/protection: I.U.D.  Other Topics Concern   Not on file  Social History Narrative   Not on file   Social Determinants of Health   Financial Resource Strain: Not on file  Food Insecurity: Not on file  Transportation Needs: Not on file  Physical Activity: Not on file  Stress: Not on file  Social Connections: Not on file  Intimate Partner Violence: Not on file    Family History  Problem Relation Age of Onset   Stroke Mother    Hypertension Mother    Diabetes Mother    Hypertension Father     Past Surgical History:  Procedure Laterality Date   NO PAST SURGERIES      ROS: Review of Systems Negative except as stated above  PHYSICAL EXAM: BP 117/77 (BP Location: Left Arm, Patient Position: Sitting, Cuff Size: Normal)   Pulse  91   Ht 4\' 10"  (1.473 m)   Wt 156 lb (70.8 kg)   SpO2 96%   BMI 32.60 kg/m   Physical Exam HENT:     Head: Normocephalic and atraumatic.     Right Ear: Tympanic membrane, ear canal and external ear normal.     Left Ear: Tympanic membrane, ear canal and external ear normal.     Nose: Nose normal.     Mouth/Throat:     Mouth: Mucous membranes are moist.     Pharynx: Oropharynx is clear.  Eyes:     Extraocular Movements: Extraocular movements intact.     Conjunctiva/sclera: Conjunctivae normal.     Pupils: Pupils are equal, round, and reactive to light.  Cardiovascular:     Rate and Rhythm: Normal rate and regular rhythm.     Pulses: Normal pulses.     Heart sounds: Normal heart sounds.  Pulmonary:     Effort: Pulmonary effort is normal.     Breath sounds: Normal breath sounds.  Chest:     Comments: Patient declined. Abdominal:     General: Bowel sounds are normal.     Palpations: Abdomen is soft.  Genitourinary:    General: Normal vulva.     Vagina: Normal.  Cervix: Normal.     Uterus: Normal.      Adnexa: Right adnexa normal and left adnexa normal.     Comments: Eustace Moore, CMA present.  Musculoskeletal:        General: Normal range of motion.     Right shoulder: Normal.     Left shoulder: Normal.     Right upper arm: Normal.     Left upper arm: Normal.     Right elbow: Normal.     Left elbow: Normal.     Right forearm: Normal.     Left forearm: Normal.     Right wrist: Normal.     Left wrist: Normal.     Right hand: Normal.     Left hand: Normal.     Cervical back: Normal, normal range of motion and neck supple.     Thoracic back: Normal.     Lumbar back: Normal.     Right hip: Normal.     Left hip: Normal.     Right upper leg: Normal.     Left upper leg: Normal.     Right knee: Normal.     Left knee: Normal.     Right lower leg: Normal.     Left lower leg: Normal.     Right ankle: Normal.     Left ankle: Normal.     Right foot: Normal.      Left foot: Normal.  Skin:    General: Skin is warm and dry.     Capillary Refill: Capillary refill takes less than 2 seconds.  Neurological:     General: No focal deficit present.     Mental Status: She is alert and oriented to person, place, and time.  Psychiatric:        Mood and Affect: Mood normal.        Behavior: Behavior normal.     ASSESSMENT AND PLAN: 1. Annual physical exam - Counseled on 150 minutes of exercise per week as tolerated, healthy eating (including decreased daily intake of saturated fats, cholesterol, added sugars, sodium), STI prevention, and routine healthcare maintenance.  2. Screening for metabolic disorder - Routine screening.  - CMP14+EGFR  3. Screening for deficiency anemia - Routine screening.  - CBC - Iron, TIBC and Ferritin Panel  4. Diabetes mellitus screening - Routine screening.  - Hemoglobin A1c  5. Screening cholesterol level - Routine screening.  - Lipid panel  6. Thyroid disorder screen - Routine screening.  - TSH  7. Pap smear for cervical cancer screening - Routine screening.  - Cytology - PAP([place])  8. Routine screening for STI (sexually transmitted infection) - Routine screening.  - Cervicovaginal ancillary only  9. Need for hepatitis C screening test - Routine screening.  - Hepatitis C Antibody   Patient was given the opportunity to ask questions.  Patient verbalized understanding of the plan and was able to repeat key elements of the plan. Patient was given clear instructions to go to Emergency Department or return to medical center if symptoms don't improve, worsen, or new problems develop.The patient verbalized understanding.   Orders Placed This Encounter  Procedures   Hepatitis C Antibody   CBC   Lipid panel   TSH   CMP14+EGFR   Hemoglobin A1c   Iron, TIBC and Ferritin Panel    Return in about 1 year (around 11/11/2023) for Physical per patient preference.  Rema Fendt, NP

## 2022-11-11 ENCOUNTER — Ambulatory Visit (INDEPENDENT_AMBULATORY_CARE_PROVIDER_SITE_OTHER): Payer: 59 | Admitting: Family

## 2022-11-11 ENCOUNTER — Encounter: Payer: Self-pay | Admitting: Family

## 2022-11-11 ENCOUNTER — Other Ambulatory Visit (HOSPITAL_COMMUNITY)
Admission: RE | Admit: 2022-11-11 | Discharge: 2022-11-11 | Disposition: A | Discharge status: Home / Self Care | Payer: 59 | Source: Ambulatory Visit | Attending: Family | Admitting: Family

## 2022-11-11 VITALS — BP 117/77 | HR 91 | Ht <= 58 in | Wt 156.0 lb

## 2022-11-11 DIAGNOSIS — Z1159 Encounter for screening for other viral diseases: Secondary | ICD-10-CM

## 2022-11-11 DIAGNOSIS — Z13228 Encounter for screening for other metabolic disorders: Secondary | ICD-10-CM | POA: Diagnosis not present

## 2022-11-11 DIAGNOSIS — Z13220 Encounter for screening for lipoid disorders: Secondary | ICD-10-CM

## 2022-11-11 DIAGNOSIS — Z1329 Encounter for screening for other suspected endocrine disorder: Secondary | ICD-10-CM | POA: Diagnosis not present

## 2022-11-11 DIAGNOSIS — Z0000 Encounter for general adult medical examination without abnormal findings: Secondary | ICD-10-CM

## 2022-11-11 DIAGNOSIS — Z113 Encounter for screening for infections with a predominantly sexual mode of transmission: Secondary | ICD-10-CM

## 2022-11-11 DIAGNOSIS — Z130 Encounter for screening for diseases of the blood and blood-forming organs and certain disorders involving the immune mechanism: Secondary | ICD-10-CM

## 2022-11-11 DIAGNOSIS — Z131 Encounter for screening for diabetes mellitus: Secondary | ICD-10-CM | POA: Diagnosis not present

## 2022-11-11 DIAGNOSIS — Z124 Encounter for screening for malignant neoplasm of cervix: Secondary | ICD-10-CM

## 2022-11-11 NOTE — Progress Notes (Signed)
Physical with pap. Requesting to have BS and iron levels checked.

## 2022-11-11 NOTE — Patient Instructions (Signed)

## 2022-11-12 ENCOUNTER — Other Ambulatory Visit: Payer: Self-pay | Admitting: Family

## 2022-11-12 DIAGNOSIS — B9689 Other specified bacterial agents as the cause of diseases classified elsewhere: Secondary | ICD-10-CM

## 2022-11-12 LAB — CERVICOVAGINAL ANCILLARY ONLY
Bacterial Vaginitis (gardnerella): POSITIVE — AB
Candida Glabrata: NEGATIVE
Candida Vaginitis: NEGATIVE
Chlamydia: NEGATIVE
Comment: NEGATIVE
Comment: NEGATIVE
Comment: NEGATIVE
Comment: NEGATIVE
Comment: NEGATIVE
Comment: NORMAL
Neisseria Gonorrhea: NEGATIVE
Trichomonas: NEGATIVE

## 2022-11-12 LAB — CBC
Hematocrit: 36.6 % (ref 34.0–46.6)
Hemoglobin: 12.3 g/dL (ref 11.1–15.9)
MCH: 28.6 pg (ref 26.6–33.0)
MCHC: 33.6 g/dL (ref 31.5–35.7)
MCV: 85 fL (ref 79–97)
Platelets: 238 10*3/uL (ref 150–450)
RBC: 4.3 x10E6/uL (ref 3.77–5.28)
RDW: 12.9 % (ref 11.7–15.4)
WBC: 4 10*3/uL (ref 3.4–10.8)

## 2022-11-12 LAB — CMP14+EGFR
ALT: 14 IU/L (ref 0–32)
AST: 15 IU/L (ref 0–40)
Albumin/Globulin Ratio: 2 (ref 1.2–2.2)
Albumin: 4.5 g/dL (ref 3.9–4.9)
Alkaline Phosphatase: 52 IU/L (ref 44–121)
BUN/Creatinine Ratio: 14 (ref 9–23)
BUN: 11 mg/dL (ref 6–20)
Bilirubin Total: 0.5 mg/dL (ref 0.0–1.2)
CO2: 20 mmol/L (ref 20–29)
Calcium: 9.1 mg/dL (ref 8.7–10.2)
Chloride: 102 mmol/L (ref 96–106)
Creatinine, Ser: 0.81 mg/dL (ref 0.57–1.00)
Globulin, Total: 2.3 g/dL (ref 1.5–4.5)
Glucose: 72 mg/dL (ref 70–99)
Potassium: 3.9 mmol/L (ref 3.5–5.2)
Sodium: 136 mmol/L (ref 134–144)
Total Protein: 6.8 g/dL (ref 6.0–8.5)
eGFR: 98 mL/min/{1.73_m2} (ref >59)

## 2022-11-12 LAB — IRON,TIBC AND FERRITIN PANEL
Ferritin: 143 ng/mL (ref 15–150)
Iron Saturation: 29 % (ref 15–55)
Iron: 93 ug/dL (ref 27–159)
Total Iron Binding Capacity: 318 ug/dL (ref 250–450)
UIBC: 225 ug/dL (ref 131–425)

## 2022-11-12 LAB — LIPID PANEL
Chol/HDL Ratio: 3.3 ratio (ref 0.0–4.4)
Cholesterol, Total: 174 mg/dL (ref 100–199)
HDL: 53 mg/dL (ref >39)
LDL Chol Calc (NIH): 113 mg/dL — ABNORMAL HIGH (ref 0–99)
Triglycerides: 40 mg/dL (ref 0–149)
VLDL Cholesterol Cal: 8 mg/dL (ref 5–40)

## 2022-11-12 LAB — HEMOGLOBIN A1C
Est. average glucose Bld gHb Est-mCnc: 111 mg/dL
Hgb A1c MFr Bld: 5.5 % (ref 4.8–5.6)

## 2022-11-12 LAB — HEPATITIS C ANTIBODY: Hep C Virus Ab: NONREACTIVE

## 2022-11-12 LAB — TSH: TSH: 1.9 u[IU]/mL (ref 0.450–4.500)

## 2022-11-12 MED ORDER — METRONIDAZOLE 500 MG PO TABS
500.0000 mg | ORAL_TABLET | Freq: Two times a day (BID) | ORAL | 0 refills | Status: AC
Start: 2022-11-12 — End: 2022-11-19

## 2022-11-13 LAB — CYTOLOGY - PAP
Comment: NEGATIVE
Diagnosis: NEGATIVE
High risk HPV: NEGATIVE

## 2022-11-26 ENCOUNTER — Encounter: Payer: Medicaid Other | Admitting: Obstetrics and Gynecology

## 2023-01-01 ENCOUNTER — Ambulatory Visit (INDEPENDENT_AMBULATORY_CARE_PROVIDER_SITE_OTHER): Payer: 59 | Admitting: Dermatology

## 2023-01-01 ENCOUNTER — Encounter: Payer: Self-pay | Admitting: Dermatology

## 2023-01-01 VITALS — BP 119/77 | HR 72

## 2023-01-01 DIAGNOSIS — B079 Viral wart, unspecified: Secondary | ICD-10-CM

## 2023-01-01 DIAGNOSIS — L910 Hypertrophic scar: Secondary | ICD-10-CM

## 2023-01-01 DIAGNOSIS — D492 Neoplasm of unspecified behavior of bone, soft tissue, and skin: Secondary | ICD-10-CM

## 2023-01-01 NOTE — Progress Notes (Signed)
   New Patient Visit   Subjective  Zoe Jensen is a 32 y.o. female who presents for the following: Skin tag right upper chest. Dur: 6-7 years Growing. Rubbed, irritated. Painful at times. Lesion on right upper back. "Looks like a blood clot under the skin" Itches at times. Dur: 5 years. Was smaller, has been growing over the years.   The patient has spots, moles and lesions to be evaluated, some may be new or changing and the patient has concerns that these could be cancer.    The following portions of the chart were reviewed this encounter and updated as appropriate: medications, allergies, medical history  Review of Systems:  No other skin or systemic complaints except as noted in HPI or Assessment and Plan.  Objective  Well appearing patient in no apparent distress; mood and affect are within normal limits.  A focused examination was performed of the following areas: Chest, back  Relevant physical exam findings are noted in the Assessment and Plan.  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  Treatment Plan:   Location: right back  Informed Consent: Discussed risks (infection, pain, bleeding, bruising, thinning of the skin, loss of skin pigment, lack of resolution, and recurrence of lesion) and benefits of the procedure, as well as the alternatives. Informed consent was obtained. Preparation: The area was prepared a standard fashion.  Anesthesia: none  Procedure Details: An intralesional injection was performed with Kenalog 40 mg/cc. 0.01 cc in total were injected.  Total number of injections: 1  Plan: The patient was instructed on post-op care. Recommend OTC analgesia as needed for pain.  NDC: 16109-604-54 [id number] Exp: 06/2023  Neoplasm of skin right upper chest  Skin / nail biopsy Type of biopsy: tangential   Informed consent: discussed and consent  obtained   Anesthesia: the lesion was anesthetized in a standard fashion   Anesthesia comment:  Area prepped with alcohol Anesthetic:  1% lidocaine w/ epinephrine 1-100,000 buffered w/ 8.4% NaHCO3 Instrument used: flexible razor blade   Hemostasis achieved with: pressure, aluminum chloride and electrodesiccation   Outcome: patient tolerated procedure well   Post-procedure details: wound care instructions given   Post-procedure details comment:  Ointment and small bandage applied  Specimen 1 - Surgical pathology Differential Diagnosis: R/O Verruca vs KA  Check Margins: No     Return in about 2 months (around 03/03/2023) for Keloid Follow Up.  I, Lawson Radar, CMA, am acting as scribe for [employer], DO.   Documentation: I have reviewed the above documentation for accuracy and completeness, and I agree with the above.  Langston Reusing, DO

## 2023-01-01 NOTE — Patient Instructions (Addendum)
Patient Handout: Wound Care for Skin Biopsy Site  Patient Handout: Wound Care for Skin Biopsy Site  Taking Care of Your Skin Biopsy Site  Proper care of the biopsy site is essential for promoting healing and minimizing scarring. This handout provides instructions on how to care for your biopsy site to ensure optimal recovery.  1. Cleaning the Wound:  Clean the biopsy site daily with gentle soap and water. Gently pat the area dry with a clean, soft towel. Avoid harsh scrubbing or rubbing the area, as this can irritate the skin and delay healing.  2. Applying Aquaphor and Bandage:  After cleaning the wound, apply a thin layer of Aquaphor ointment to the biopsy site. Cover the area with a sterile bandage to protect it from dirt, bacteria, and friction. Change the bandage daily or as needed if it becomes soiled or wet.  3. Continued Care for One Week:  Repeat the cleaning, Aquaphor application, and bandaging process daily for one week following the biopsy procedure. Keeping the wound clean and moist during this initial healing period will help prevent infection and promote optimal healing.  4. Massaging Aquaphor into the Area:  ---After one week, discontinue the use of bandages but continue to apply Aquaphor to the biopsy site. ----Gently massage the Aquaphor into the area using circular motions. ---Massaging the skin helps to promote circulation and prevent the formation of scar tissue.   Additional Tips:  Avoid exposing the biopsy site to direct sunlight during the healing process, as this can cause hyperpigmentation or worsen scarring. If you experience any signs of infection, such as increased redness, swelling, warmth, or drainage from the wound, contact your healthcare provider immediately. Follow any additional instructions provided by your healthcare provider for caring for the biopsy site and managing any discomfort. Conclusion:  Taking proper care of your skin biopsy site  is crucial for ensuring optimal healing and minimizing scarring. By following these instructions for cleaning, applying Aquaphor, and massaging the area, you can promote a smooth and successful recovery. If you have any questions or concerns about caring for your biopsy site, don't hesitate to contact your healthcare provider for guidance.      Intralesional steroid injection side effects were reviewed including thinning of the skin and discoloration, such as redness, lightening or darkening.   Skin Education :   I counseled the patient regarding the following: Sun screen (SPF 30 or greater) should be applied during peak UV exposure (between 10am and 2pm) and reapplied after exercise or swimming.  The ABCDEs of melanoma were reviewed with the patient, and the importance of monthly self-examination of moles was emphasized. Should any moles change in shape or color, or itch, bleed or burn, pt will contact our office for evaluation sooner then their interval appointment.  Plan: Sunscreen Recommendations I recommended a broad spectrum sunscreen with a SPF of 30 or higher. I explained that SPF 30 sunscreens block approximately 97 percent of the sun's harmful rays. Sunscreens should be applied at least 15 minutes prior to expected sun exposure and then every 2 hours after that as long as sun exposure continues. If swimming or exercising sunscreen should be reapplied every 45 minutes to an hour after getting wet or sweating. One ounce, or the equivalent of a shot glass full of sunscreen, is adequate to protect the skin not covered by a bathing suit. I also recommended a lip balm with a sunscreen as well. Sun protective clothing can be used in lieu of sunscreen but must  be worn the entire time you are exposed to the sun's rays.   Due to recent changes in healthcare laws, you may see results of your pathology and/or laboratory studies on MyChart before the doctors have had a chance to review them. We  understand that in some cases there may be results that are confusing or concerning to you. Please understand that not all results are received at the same time and often the doctors may need to interpret multiple results in order to provide you with the best plan of care or course of treatment. Therefore, we ask that you please give [place] 2 business days to thoroughly review all your results before contacting the office for clarification. Should we see a critical lab result, you will be contacted sooner.   If You Need Anything After Your Visit  If you have any questions or concerns for your doctor, please call our main line at [phone] If no one answers, please leave a voicemail as directed and we will return your call as soon as possible. Messages left after 4 pm will be answered the following business day.   You may also send [place] a message via MyChart. We typically respond to MyChart messages within 1-2 business days.  For prescription refills, please ask your pharmacy to contact our office. Our fax number is [phone].  If you have an urgent issue when the clinic is closed that cannot wait until the next business day, you can page your doctor at the number below.    Please note that while we do our best to be available for urgent issues outside of office hours, we are not available 24/7.   If you have an urgent issue and are unable to reach [place], you may choose to seek medical care at your doctor's office, retail clinic, urgent care center, or emergency room.  If you have a medical emergency, please immediately call 911 or go to the emergency department. In the event of inclement weather, please call our main line at [phone] for an update on the status of any delays or closures.  Dermatology Medication Tips: Please keep the boxes that topical medications come in in order to help keep track of the instructions about where and how to use these. Pharmacies typically print the medication  instructions only on the boxes and not directly on the medication tubes.   If your medication is too expensive, please contact our office at [phone] or send [place] a message through MyChart.   We are unable to tell what your co-pay for medications will be in advance as this is different depending on your insurance coverage. However, we may be able to find a substitute medication at lower cost or fill out paperwork to get insurance to cover a needed medication.   If a prior authorization is required to get your medication covered by your insurance company, please allow [place] 1-2 business days to complete this process.  Drug prices often vary depending on where the prescription is filled and some pharmacies may offer cheaper prices.  The website www.goodrx.com contains coupons for medications through different pharmacies. The prices here do not account for what the cost may be with help from insurance (it may be cheaper with your insurance), but the website can give you the price if you did not use any insurance.  - You can print the associated coupon and take it with your prescription to the pharmacy.  - You may also stop by our office during regular  business hours and pick up a GoodRx coupon card.  - If you need your prescription sent electronically to a different pharmacy, notify our office through [facility] or by phone at [phone]

## 2023-01-06 NOTE — Progress Notes (Signed)
Hi [name]  Dr. Onalee Hua reviewed your biopsy results and it showed the spot removed was benign (not cancerous).  No additional treatment is required.  The detailed report is available to view in MyChart.  Have a great day!  Kind Regards,  Dr. Kermit Balo Care Team

## 2023-01-25 ENCOUNTER — Encounter: Payer: Self-pay | Admitting: Dermatology

## 2023-03-10 ENCOUNTER — Ambulatory Visit: Payer: 59 | Admitting: Dermatology

## 2023-05-18 ENCOUNTER — Other Ambulatory Visit: Payer: 59
